# Patient Record
Sex: Female | Born: 1955 | Race: White | Hispanic: No | Smoking: Current every day smoker
Health system: Southern US, Community
[De-identification: ages and names within clinical notes are randomized; demographics above are authoritative.]

## PROBLEM LIST (undated history)

## (undated) DIAGNOSIS — K5792 Diverticulitis of intestine, part unspecified, without perforation or abscess without bleeding: Secondary | ICD-10-CM

## (undated) DIAGNOSIS — F32A Depression, unspecified: Secondary | ICD-10-CM

## (undated) DIAGNOSIS — I639 Cerebral infarction, unspecified: Secondary | ICD-10-CM

## (undated) DIAGNOSIS — F329 Major depressive disorder, single episode, unspecified: Secondary | ICD-10-CM

## (undated) DIAGNOSIS — N200 Calculus of kidney: Secondary | ICD-10-CM

## (undated) DIAGNOSIS — F4001 Agoraphobia with panic disorder: Secondary | ICD-10-CM

## (undated) DIAGNOSIS — I10 Essential (primary) hypertension: Secondary | ICD-10-CM

## (undated) DIAGNOSIS — K760 Fatty (change of) liver, not elsewhere classified: Secondary | ICD-10-CM

## (undated) HISTORY — DX: Diverticulitis of intestine, part unspecified, without perforation or abscess without bleeding: K57.92

## (undated) HISTORY — DX: Major depressive disorder, single episode, unspecified: F32.9

## (undated) HISTORY — DX: Calculus of kidney: N20.0

## (undated) HISTORY — DX: Fatty (change of) liver, not elsewhere classified: K76.0

## (undated) HISTORY — DX: Agoraphobia with panic disorder: F40.01

## (undated) HISTORY — DX: Cerebral infarction, unspecified: I63.9

## (undated) HISTORY — DX: Essential (primary) hypertension: I10

## (undated) HISTORY — DX: Depression, unspecified: F32.A

---

## 1980-06-02 HISTORY — PX: TONSILLECTOMY: SHX5217

## 1989-06-02 HISTORY — PX: TUBAL LIGATION: SHX77

## 1995-06-03 LAB — HM PAP SMEAR: HM Pap smear: NORMAL

## 1997-12-13 ENCOUNTER — Ambulatory Visit (HOSPITAL_COMMUNITY): Admission: RE | Admit: 1997-12-13 | Discharge: 1997-12-13 | Payer: Self-pay | Admitting: Family Medicine

## 2000-06-02 HISTORY — PX: OTHER SURGICAL HISTORY: SHX169

## 2000-12-31 ENCOUNTER — Ambulatory Visit (HOSPITAL_COMMUNITY): Admission: RE | Admit: 2000-12-31 | Discharge: 2000-12-31 | Payer: Self-pay | Admitting: Family Medicine

## 2001-07-15 ENCOUNTER — Encounter: Admission: RE | Admit: 2001-07-15 | Discharge: 2001-07-15 | Payer: Self-pay | Admitting: *Deleted

## 2001-09-14 ENCOUNTER — Encounter: Admission: RE | Admit: 2001-09-14 | Discharge: 2001-09-14 | Payer: Self-pay | Admitting: *Deleted

## 2001-09-15 ENCOUNTER — Other Ambulatory Visit: Admission: RE | Admit: 2001-09-15 | Discharge: 2001-09-15 | Payer: Self-pay | Admitting: *Deleted

## 2001-09-28 ENCOUNTER — Encounter: Admission: RE | Admit: 2001-09-28 | Discharge: 2001-09-28 | Payer: Self-pay | Admitting: *Deleted

## 2002-04-21 ENCOUNTER — Ambulatory Visit (HOSPITAL_BASED_OUTPATIENT_CLINIC_OR_DEPARTMENT_OTHER): Admission: RE | Admit: 2002-04-21 | Discharge: 2002-04-21 | Payer: Self-pay | Admitting: Obstetrics and Gynecology

## 2002-04-21 ENCOUNTER — Encounter (INDEPENDENT_AMBULATORY_CARE_PROVIDER_SITE_OTHER): Payer: Self-pay | Admitting: *Deleted

## 2002-08-01 ENCOUNTER — Emergency Department (HOSPITAL_COMMUNITY): Admission: EM | Admit: 2002-08-01 | Discharge: 2002-08-01 | Payer: Self-pay | Admitting: Emergency Medicine

## 2004-03-07 ENCOUNTER — Ambulatory Visit: Payer: Self-pay | Admitting: Family Medicine

## 2004-07-10 ENCOUNTER — Ambulatory Visit: Payer: Self-pay | Admitting: Family Medicine

## 2004-09-24 ENCOUNTER — Ambulatory Visit: Payer: Self-pay | Admitting: Family Medicine

## 2004-10-22 ENCOUNTER — Ambulatory Visit: Payer: Self-pay | Admitting: Family Medicine

## 2005-02-21 ENCOUNTER — Ambulatory Visit: Payer: Self-pay | Admitting: Obstetrics & Gynecology

## 2005-02-21 ENCOUNTER — Other Ambulatory Visit: Admission: RE | Admit: 2005-02-21 | Discharge: 2005-02-21 | Payer: Self-pay | Admitting: Obstetrics & Gynecology

## 2005-03-11 ENCOUNTER — Ambulatory Visit: Payer: Self-pay | Admitting: Obstetrics & Gynecology

## 2005-04-15 ENCOUNTER — Ambulatory Visit: Payer: Self-pay | Admitting: Obstetrics & Gynecology

## 2005-06-02 DIAGNOSIS — I639 Cerebral infarction, unspecified: Secondary | ICD-10-CM

## 2005-06-02 HISTORY — DX: Cerebral infarction, unspecified: I63.9

## 2005-09-06 ENCOUNTER — Emergency Department (HOSPITAL_COMMUNITY): Admission: EM | Admit: 2005-09-06 | Discharge: 2005-09-06 | Payer: Self-pay | Admitting: Family Medicine

## 2006-05-31 ENCOUNTER — Inpatient Hospital Stay (HOSPITAL_COMMUNITY): Admission: EM | Admit: 2006-05-31 | Discharge: 2006-06-02 | Payer: Self-pay | Admitting: Family Medicine

## 2007-03-07 ENCOUNTER — Emergency Department (HOSPITAL_COMMUNITY): Admission: EM | Admit: 2007-03-07 | Discharge: 2007-03-07 | Payer: Self-pay | Admitting: Emergency Medicine

## 2010-10-18 NOTE — H&P (Signed)
NAMEMAKARA, LANZO NO.:  [id number]   MEDICAL RECORD NO.:  [id number]          PATIENT TYPE:  EMS   LOCATION:  MAJO                         FACILITY:  [facility]   PHYSICIAN:  Marlan Palau, M.D.  DATE OF BIRTH:  [birth year]   DATE OF ADMISSION:  05/31/2006  DATE OF DISCHARGE:                              HISTORY & PHYSICAL   Audio too short to transcribe (less than 5 seconds)      C. Lesia Sago, M.D.     CKW/MEDQ  D:  05/31/2006  T:  05/31/2006  Job:  [id number]

## 2010-10-18 NOTE — Op Note (Signed)
   Diana Glenn, Diana Glenn                          ACCOUNT NO.:  [id number]   MEDICAL RECORD NO.:  [id number]                   PATIENT TYPE:  AMB   LOCATION:  [facility]                                 FACILITY:  [facility]   PHYSICIAN:  Katherine Roan, M.D.               DATE OF BIRTH:  08/26/1955   DATE OF PROCEDURE:  04/21/2002  DATE OF DISCHARGE:                                 OPERATIVE REPORT   PREOPERATIVE DIAGNOSES:  Dysmenorrhea and menorrhagia.   POSTOPERATIVE DIAGNOSES:  Dysmenorrhea and menorrhagia. Pelvic endometriosis  and small submucous uterine fibroid.   OPERATION PERFORMED:  Laparoscopy with LUNA procedure and ablation of  endometriosis with the YAG laser. Hysteroscopy with resection of a small  submucous fibroid.   DESCRIPTION OF PROCEDURE:  The patient was placed in lithotomy position,  prepped and draped in the usual fashion, bladder emptied. A transverse  incision was made in the umbilicus. The Veress needle was inserted and the  abdomen was distended with carbon dioxide. Aspiration infusion was utilized  to be sure that we were in the peritoneal cavity. A trocar was inserted into  the abdomen, visualization of the pelvis revealed two status post tubal  ligation areas on her tubes. There was focal endometriosis on the right  ovary on the left cul-de-sac and deep insertion of the uterosacral  ligaments. On the uterosacral ligaments bilaterally and anteriorly there was  evidence of old scarred endometriosis. Using the YAG laser, I was able to  ablate the endometriosis. Irrigation was accomplished. A LUNA procedure was  performed bilaterally. The gas was evacuated under direct vision and the  incisions which were two, 1 infraumbilical and 1 suprapubic were closed with  4-0 PDS along with a 2-0 Vicryl for the fascia on the umbilical incision.  Following this, I went down below and carefully dilated the cervix, put a  hysteroscope in the uterus and found a small right sided  submucous myoma  which I resected very carefully. All the resected material was sent to the  lab for study. Hemostasis was accomplished with the rollerball. No unusual  blood loss occurred. At the termination of the procedure, she was tolerating  the anesthesia well and was taken to the recovery room.                                                 Katherine Roan, M.D.    SDM/MEDQ  D:  04/21/2002  T:  04/21/2002  Job:  [id number]

## 2010-10-18 NOTE — Group Therapy Note (Signed)
NAMEAFTIN, LYE NO.:  [id number]   MEDICAL RECORD NO.:  [id number]          PATIENT TYPE:  WOC   LOCATION:  [facility]                   FACILITY:  [facility]   PHYSICIAN:  Elsie Lincoln, MD      DATE OF BIRTH:  27-Dec-1955   DATE OF SERVICE:  04/15/2005                                    CLINIC NOTE   The patient is a 55 year old female who returns to me for follow-up after  using Aldara for several weeks. The patient had a large reaction and stopped  using it and would like to switch to TCA. She does have some perianal warts  and 80% TCA was applied. The patient tolerated it well. She will come back  in 1 week for repeat application.           ______________________________  Elsie Lincoln, MD     KL/MEDQ  D:  04/15/2005  T:  04/16/2005  Job:  [id number]

## 2010-10-18 NOTE — Group Therapy Note (Signed)
NAMEMCKENZYE, CUTRIGHT NO.:  [id number]   MEDICAL RECORD NO.:  [id number]          PATIENT TYPE:  WOC   LOCATION:  [facility]                   FACILITY:  [facility]   PHYSICIAN:  Elsie Lincoln, MD      DATE OF BIRTH:  06-May-1956   DATE OF SERVICE:  03/11/2005                                    CLINIC NOTE   Patient is a 55 year old female who presented to me last week for evaluation  of vulvar lesion.  We did a biopsy which healed well and it comes back  seborrheic keratosis feature of condyloma acuminatum.  This is probably most  likely just warts so I offered her TCA application versus Aldara.  We did  have some samples of Aldara so we will try that.  She was given four samples  and told to apply to the area only three times a week and wash off 6-10  hours after application.  Patient is not to have sexual intercourse while  the cream is on her vulva and the patient once again was told that it has to  be done very sparingly.  If she gets exudative lesion that is weeping she  needs to stop the application for several days.  Patient come back to me in  two weeks for surveillance to see how she is doing.           ______________________________  Elsie Lincoln, MD     KL/MEDQ  D:  03/11/2005  T:  03/12/2005  Job:  [id number]

## 2010-10-18 NOTE — H&P (Signed)
NAMEERNESTEEN, MIHALIC NO.:  [id number]   MEDICAL RECORD NO.:  [id number]          PATIENT TYPE:  EMS   LOCATION:  MAJO                         FACILITY:  [facility]   PHYSICIAN:  Marlan Palau, M.D.  DATE OF BIRTH:  Dec 12, 1955   DATE OF ADMISSION:  05/31/2006  DATE OF DISCHARGE:                              HISTORY & PHYSICAL   HISTORY OF PRESENT ILLNESS:  Diana Glenn is a 55 year old right-  handed white female with a history of significant depression requiring  hospitalization in the past.  The patient is followed with Mental Health  for this.  This patient comes to [facility] for an evaluation  of onset of some slurred speech, gait disturbance, tendency to veer to  the right, some questionable right facial droop.  Patient has also  developed a bifrontal headache.  Patient claims she has headaches  frequently 2-3x a week for a number of years.  Patient has never had  events like this associated with her headache.  Patient has also noted  that her handwriting has been affected and it is quite clumsy and is  sloppy.  Patient does state that she has some problems with the use of  the right arm.  Patient, again, veers to the right when walking.  Has  not had any falls.  Patient went to urgent medical care and was referred  to [facility] for further evaluation.  A CT scan of the brain  was unremarkable by my reading.  At the current time, the patient scores  a zero on the NIH stroke scale.   PAST MEDICAL HISTORY:  Is significant for:  1. New onset of gait disturbance as above, rule out small-vessel      ischemic infarct, posterior circulation infarct.  2. Bilateral tubal ligation.  3. Laparoscopic surgery in the past.  4. Tonsillectomy.  5. Depression.  6. A history of headaches.   MEDICATIONS:  Include:  1. Effexor 300 mg daily.  2. Klonopin 0.5 mg daily.   Patient smokes a pack of cigarettes a day, drinks about 8 ounces of  Vodka a  night.   Patient states an ALLERGY to CORTISONE.   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   FAMILY MEDICAL HISTORY:  Notable that mother died with emphysema.  Father died with emphysema.  Patient has 12 brothers and sisters, 2 of  which have died, 1 by an overdose and 1 with COPD.  Has a history of  lung cancer in 1 sibling.  Another brother has bladder cancer.  A  history of diabetes in the family as well.   REVIEW OF SYSTEMS:  Notable for no recent fevers, chills.  Patient does  report the headaches as above.  Denies neck pain, shortness of breath,  chest pains or palpitations of the heart.  Denies any vomiting but did  have nausea earlier today.  Patient denies any problems with controlling  the bowels but notes some chronic problems  with stress incontinence of  the bladder.  Patient denies any numbness or focal weakness of the arms  or legs or face.  Has not had any blackout episodes or seizures.   PHYSICAL EXAMINATION:  VITALS:  Blood pressure is 145/94, heart rate  101, respiratory rate 18, temperature afebrile.  GENERAL:  This patient is a fairly well-developed white female who is  alert and cooperative at the time of examination.  HEENT EXAMINATION:  Head:  Atraumatic.  Eyes:  Pupils are equal, round,  react to light.  Disks are flat bilaterally.  NECK:  Supple.  No carotid bruits noted.  RESPIRATORY EXAMINATION:  Clear.  CARDIOVASCULAR EXAMINATION:  Reveals a regular rate and rhythm.  No  obvious murmurs or rubs noted.  EXTREMITIES:  All without significant edema.  ABDOMEN:  Reveals positive bowel sounds, no organomegaly or tenderness  noted.  NEUROLOGIC EXAMINATION:  Cranial nerves as above.  Facial symmetry is  seen.  Patient has good sensation to pinprick bilaterally on the face.  Has full extraocular movements.  No nystagmus is seen.  Visual fields  are  full to double simultaneous stimulation.  Speech is well-enunciated,  not aphasic.  Motor test reveals 5/5 strength in all fours.  Good  symmetric motor tone is noted throughout.  Sensory testing is intact to  pinprick, soft touch and vibratory sensation throughout.  Patient has  good finger-to-nose-to finger and heel-to-shin bilaterally.  Gait was  not tested.  No drift is seen in the upper or lower extremities.  Deep  tendon reflexes are symmetrical and normal.  Toes are neutral  bilaterally.   CT scan of the head is as above.   LABORATORY VALUES:  Include a white count of 12.4, hemoglobin of 15.9,  hematocrit of 46.6, MCV of 94.2, platelets of 269.  The rest of the  blood work is still pending.   IMPRESSION:  1. New onset of gait disturbance and clumsiness, rule out cerebellar      or small brain stem cerebrovascular infarct.  2. Hypertension, untreated.  3. Depression.   This patient does have some problems with blood pressure today.  Patient  does not really have a primary care physician and is followed through  mental health only.  Patient will be admitted at this point for a brief  workup to help manage blood pressures.  Patient is not a candidate for  tissue plasminogen activator due to duration of symptoms and to minimal  deficit.   PLAN:  1. Admission to [facility].  2. MRI of the brain.  3. MRI angiogram of the intracranial and extracranial vessels.  4. A 2-D echocardiogram.  5. Urine drug screen.  6. We will follow patient's clinical course while in-house.  We will      get physical and occupational therapy to see this patient.      Marlan Palau, M.D.  Electronically Signed     CKW/MEDQ  D:  05/31/2006  T:  06/01/2006  Job:  [id number]

## 2010-10-18 NOTE — Discharge Summary (Signed)
NAMEIVERNA, HAMMAC              ACCOUNT NO.:  [id number]   MEDICAL RECORD NO.:  [id number]          PATIENT TYPE:  INP   LOCATION:  3006                         FACILITY:  [facility]   PHYSICIAN:  Casimiro Needle L. Reynolds, M.D.DATE OF BIRTH:  10-24-1955   DATE OF ADMISSION:  05/31/2006  DATE OF DISCHARGE:  06/02/2006                               DISCHARGE SUMMARY   ADMISSION DIAGNOSES:  1. New onset gait disturbance with a clumsiness, rule out cerebellar      or small brain stem cerebellar infarct.  2. Untreated hypertension.  3. Depression.   DISCHARGE DIAGNOSES:  1. Left brain subcortical stroke with resolving mild right      hemiparesis.  2. Hypertension, improved control.  3. History of depression.  4. Tobacco abuse.   CONDITION ON DISCHARGE:  Improved.   DIET:  Low sat, low fat, low cholesterol.   ACTIVITY:  Ad lib.   MEDICATIONS AT DISCHARGE:  1. Aspirin 81 mg daily (new).  2. Lisinopril 10 mg daily (new).  3. Effexor 150 mg b.i.d.  4. Klonopin p.r.n.   STUDIES:  1. CT of the head performed May 31, 2006, demonstrating no acute      findings.  2. MRI of the brain performed June 01, 2006, demonstrating a small      acute non hemorrhagic infarct involving white matter adjacent to      the posterior aspect of left lateral ventricle with no significant      abnormalities.  3. MRA of the intracranial circulation demonstrating mild intracranial      atherosclerotic changes but no critical stenosis.  4. MR angiography of the neck with contrast demonstrating no evidence      of hemodynamically significant stenosis involving carotid      bifurcation and loss of signal on the right vetebral artery,      representing stenosis versus artifact.  5. Chest x-ray May 31, 2006, left base granulomata.  No active      cardiopulmonary disease.  6. Laboratory review:  CBC white count 12.4.  Hemoglobin 15.9.      Platelets [id number].  Coags normal.  Chemistry unremarkable.    Hemoglobin A1c 6.0.  Homocysteine normal at 12.9.  Cardiac enzymes      negative.  Lipid profile total cholesterol 185.  Triglycerides 149.      HDL 53.  LDL slightly elevated at 102.  Urine pregnancy test      negative.  Urine drug screen unremarkable.  7. Transthoracic cardiogram performed on June 01, 2006      demonstrated normal LV function.  EF 55%.  No regional wall motion      abnormalities.  Mildly increase of the aortic valve.  8. EKG June 01, 2006.  No significant T-wave abnormality.  Normal      sinus rhythm.  9. Carotid Doppler study June 02, 2007, demonstrating intracranial      vetebral artery flow with no significant.  ICA stenosis on either      side.  10.Transcranial Doppler study June 01, 2006, demonstrating normal      velocity using anterior and posterior  circulations.   HOSPITAL COURSE:  Please see admission H&P by Dr. Lesia Sago on  May 31, 2006, for full admission details.  Briefly, this is a 55-  year-old woman who presented to the emergency department with slurred  speech, gait disturbance, tendency to veer to the right and right facial  droop.  She had an NIH stroke score of 0 but was felt to have some mild  acute abnormalities.  She had a blood pressure of 145/94 in the  emergency department.  She was told she was likely to have a small  stroke and was subsequently admitted to the stroke service.  She  underwent a routine stroke workup with the results per tests as noted  above.  It was felt the most appropriate stroke prophylaxis for her  would be antistroke therapy with aspirin as well as risk factor  reduction with an ACE inhibitor.  She was started on low dose aspirin  and lisinopril was tolerated as well.  By June 01, 2006 she was up  walking around but complaining that she was still having difficulty with  her right leg.  By June 02, 2006 she was feeling well and wished to go  home.  She was discharged at that time on  aspirin and lisinopril and was  advised to stop smoking and followup with a primary care physician  regarding her blood pressure.   FOLLOWUP:  She was advised to establish a primary care physician and  followup regarding blood pressure.  She is also asked to call to make an  appointment with Dr. Anne Hahn at [facility] in 2 months.      Michael L. Thad Ranger, M.D.  Electronically Signed     MLR/MEDQ  D:  09/17/2006  T:  09/18/2006  Job:  [id number]

## 2010-10-18 NOTE — Discharge Summary (Signed)
NAMEMARILUZ, Diana Glenn              ACCOUNT NO.:  [id number]   MEDICAL RECORD NO.:  [id number]          PATIENT TYPE:  INP   LOCATION:  3006                         FACILITY:  [facility]   PHYSICIAN:  Casimiro Needle L. Reynolds, M.D.DATE OF BIRTH:  09-30-1955   DATE OF ADMISSION:  05/31/2006  DATE OF DISCHARGE:                               DISCHARGE SUMMARY   INAUDIBLE DICTATION      Casimiro Needle L. Thad Ranger, M.D.  Electronically Signed     MLR/MEDQ  D:  06/02/2006  T:  06/03/2006  Job:  [id number]

## 2010-10-18 NOTE — Group Therapy Note (Signed)
Diana Glenn, Diana Glenn NO.:  [id number]   MEDICAL RECORD NO.:  [id number]          PATIENT TYPE:  WOC   LOCATION:  [facility]                   FACILITY:  [facility]   PHYSICIAN:  Elsie Lincoln, MD      DATE OF BIRTH:  [birth year]   DATE OF SERVICE:                                    CLINIC NOTE   Patient is a 55 year old perimenopausal woman who was sent to me from [facility] for evaluation of possible condyloma versus skin tags on her perineum.  The patient states that the lesions have been there for at least a year, but  could be longer.  They do itch.  They occasionally bleed as well.  The  patient is sexually active with one partner for the past four years.  She  has had 2 menstrual cycles in the past 10 months.  The last one being 3  months ago.  The patient desires to have resolution of the lesions.   PAST MEDICAL HISTORY:  Panic attacks, depression.   PAST SURGICAL HISTORY:  A myomectomy and surgery for endometriosis.   PAST GYN HISTORY:  NSTD x4.  No STDs.  No abnormal Pap smears.  Positive  history of fibroids as noted above.   ALLERGIES:  Cortisone questionably trouble breathing.   CURRENT MEDICATIONS:  Effexor and Klonopin.   Last Pap smear March 2006.  Last mammogram 11 years ago.  The patient was  given information of a mammogram scholarship.   PHYSICAL EXAMINATION:  VITAL SIGNS:  Pulse 92, blood pressure 139/95. Weight  154.3.  Height 5 feet 4 inches.  FOCUSED EXAM:  Limited physical exam.  The anus and perineum are the focal  sites of the lesions. There are approximately 10 some variegated.  Others  are flat.  They are dark in pigment.  One definitely looks like a wart,  however, the other ones are more erythematous and more macular and  discolored. Recommended biopsy.  The patient was consented for biopsy.   PROCEDURE:  Consent obtained.  Area cleaned; 1 mL of 1% lidocaine injected  into the area.  A punch biopsy was obtained.  Hemostasis was  achieved with  __________ and 1 distal 4-0 Vicryl figure-of-eight.  Good hemostasis at the  end of the procedure.  The patient tolerated the procedure well.   IMPRESSION/PLAN:  A 55 year old with questionable warts versus other  pathology on her perineum.  The patient is to come back in 2 weeks for  results and possible treatment if just warts.           ______________________________  Elsie Lincoln, MD     KL/MEDQ  D:  02/21/2005  T:  02/24/2005  Job:  [id number]

## 2011-03-13 LAB — URINALYSIS, ROUTINE W REFLEX MICROSCOPIC
Protein, ur: NEGATIVE
pH: 5.5

## 2011-03-13 LAB — URINE CULTURE: Colony Count: 10000

## 2011-04-16 ENCOUNTER — Ambulatory Visit: Payer: Self-pay | Admitting: Family

## 2011-04-28 ENCOUNTER — Ambulatory Visit (INDEPENDENT_AMBULATORY_CARE_PROVIDER_SITE_OTHER): Payer: Self-pay | Admitting: Family

## 2011-04-28 ENCOUNTER — Encounter: Payer: Self-pay | Admitting: Family

## 2011-04-28 DIAGNOSIS — F32A Depression, unspecified: Secondary | ICD-10-CM

## 2011-04-28 DIAGNOSIS — Z8719 Personal history of other diseases of the digestive system: Secondary | ICD-10-CM

## 2011-04-28 DIAGNOSIS — Z8673 Personal history of transient ischemic attack (TIA), and cerebral infarction without residual deficits: Secondary | ICD-10-CM

## 2011-04-28 DIAGNOSIS — Z87442 Personal history of urinary calculi: Secondary | ICD-10-CM

## 2011-04-28 DIAGNOSIS — F3289 Other specified depressive episodes: Secondary | ICD-10-CM

## 2011-04-28 DIAGNOSIS — F419 Anxiety disorder, unspecified: Secondary | ICD-10-CM

## 2011-04-28 DIAGNOSIS — F17200 Nicotine dependence, unspecified, uncomplicated: Secondary | ICD-10-CM

## 2011-04-28 DIAGNOSIS — F411 Generalized anxiety disorder: Secondary | ICD-10-CM

## 2011-04-28 DIAGNOSIS — I10 Essential (primary) hypertension: Secondary | ICD-10-CM

## 2011-04-28 DIAGNOSIS — F329 Major depressive disorder, single episode, unspecified: Secondary | ICD-10-CM

## 2011-04-28 DIAGNOSIS — Z720 Tobacco use: Secondary | ICD-10-CM

## 2011-04-28 MED ORDER — LISINOPRIL 10 MG PO TABS: 10.0000 mg | ORAL_TABLET | Freq: Every day | ORAL | Status: DC

## 2011-04-28 NOTE — Progress Notes (Signed)
Subjective:    Patient ID: Diana Glenn, female    DOB: 24-Dec-1955, 55 y.o.   MRN: [id number]  HPI Diana Glenn   HTN- She was diagnosed in 2007.   She is treated with lisinopril.  She reports that her sbp often ranges 120's to 140's.    Depression- She is treated with zoloft.  She has been on medication for her depression x 30 yrs.  She was on effexor when she had a stroke.  She had one hospitalization for depression in 1984 for 2 months ([facility]).  She reports that she had agoraphobia.  She reports that in the last year her depression has been well controlled despite a recent move from [place] to [place].    Anxiety-  She reports that she has rare panic attacks.  She uses klonopin 1/2 tablet a day.  Diverticulitis- diagnosed at [facility] (2 weeks) ago.  She was treated with flagyl.  She notes intermittent diarrhea alternating with diarrhea.  Pain is resolved. Denies bloody stools.   Kidney stones-  She reports that she has had 3 episodes of kidney stones in the last year.  She saw Urology. She was seen by Urology in [place].    Stroke-  This occurred in 2007.  She initially had right arm/leg weakness.  She reports that her BP was high at that time.  She was treated at [facility].  She denies any residual weakness or deficits.    Tobacco abuse- she is smoking 1 1/2 PPD.  She has tried the patch in the past as well as wellbutrin.  She had some muscle jerking on the Wellbutrin.  ETOH use- was having a "couple of drinks." She stopped about 2 weeks ago.       Review of Systems  Constitutional:       She notes several pound weight loss with diverticulitis was 168 pounds.  HENT: Negative for congestion.   Respiratory:       Notes mild ACE cough.  Cardiovascular: Negative for chest pain and leg swelling.  Gastrointestinal: Negative for abdominal pain and blood in stool.  Genitourinary:       Stress incontinence. LMP 18 months ago  Musculoskeletal: Negative for myalgias and arthralgias.    Skin: Negative for rash.  Neurological:       Occasional Ha's that she attributes to sinus.  Hematological: Does not bruise/bleed easily.  Psychiatric/Behavioral:       See HPI   Past Medical History  Diagnosis Date  . Depression   . Diverticulitis   . Hypertension   . Stroke 2007  . Kidney stone   . Urinary incontinence   . Agoraphobia with panic attacks     History   Social History  . Marital Status: Widowed    Spouse Name: N/A    Number of Children: 4  . Years of Education: N/A   Occupational History  . Not on file.   Social History Main Topics  . Smoking status: Current Everyday Smoker -- 1.5 packs/day for 30 years    Types: Cigarettes  . Smokeless tobacco: Not on file  . Alcohol Use: No  . Drug Use: No  . Sexually Active: Not on file   Other Topics Concern  . Not on file   Social History Narrative   Caffeine Use:  2 cups dailyRegular exercise:  No    Past Surgical History  Procedure Date  . Tonsillectomy 1982  . Tubal ligation 1991  . Uterine ablation 2002  Family History  Problem Relation Age of Onset  . Cancer Sister     lung  . Hypertension Brother   . Cancer Sister     lung  . Hypertension Brother   . Diabetes Brother   . Hypertension Brother   . Diabetes Brother     Allergies  Allergen Reactions  . Bactrim Itching and Rash    No current outpatient prescriptions on file prior to visit.    BP 94/70  Pulse 78  Temp(Src) 97.9 F (36.6 C) (Oral)  Resp 16  Ht 5\' 4"  (1.626 m)  Wt 161 lb (73.029 kg)  BMI 27.64 kg/m2  LMP 06/27/2008       Objective:   Physical Exam  Constitutional: She appears well-developed and well-nourished. No distress.  HENT:  Head: Normocephalic and atraumatic.  Eyes: Conjunctivae are normal.  Neck: Normal range of motion. Neck supple.  Cardiovascular: Normal rate and regular rhythm.   No murmur heard. Pulmonary/Chest: Effort normal and breath sounds normal. No respiratory distress. She has no  wheezes. She has no rales. She exhibits no tenderness.  Abdominal: Soft. Bowel sounds are normal. She exhibits no distension and no mass. There is no tenderness. There is no rebound and no guarding.  Musculoskeletal: She exhibits no edema.  Skin: Skin is warm and dry.  Psychiatric: She has a normal mood and affect. Her behavior is normal. Judgment normal.          Assessment & Plan:

## 2011-04-28 NOTE — Patient Instructions (Addendum)
Please follow up in 1 month for a complete physical.  Come fasting to this appointment.   Welcome to [facility]!

## 2011-04-29 ENCOUNTER — Telehealth: Payer: Self-pay | Admitting: *Deleted

## 2011-04-29 NOTE — Telephone Encounter (Signed)
Records received from [facility] for 04/10/11 date of service and forwarded to Provider for review.

## 2011-05-02 DIAGNOSIS — I10 Essential (primary) hypertension: Secondary | ICD-10-CM | POA: Insufficient documentation

## 2011-05-02 DIAGNOSIS — Z8673 Personal history of transient ischemic attack (TIA), and cerebral infarction without residual deficits: Secondary | ICD-10-CM | POA: Insufficient documentation

## 2011-05-02 DIAGNOSIS — F419 Anxiety disorder, unspecified: Secondary | ICD-10-CM | POA: Insufficient documentation

## 2011-05-02 DIAGNOSIS — Z8719 Personal history of other diseases of the digestive system: Secondary | ICD-10-CM | POA: Insufficient documentation

## 2011-05-02 DIAGNOSIS — F418 Other specified anxiety disorders: Secondary | ICD-10-CM | POA: Insufficient documentation

## 2011-05-02 DIAGNOSIS — Z87442 Personal history of urinary calculi: Secondary | ICD-10-CM | POA: Insufficient documentation

## 2011-05-02 DIAGNOSIS — Z720 Tobacco use: Secondary | ICD-10-CM | POA: Insufficient documentation

## 2011-05-02 NOTE — Assessment & Plan Note (Signed)
Has seen urology in the past.  Stable at present. Monitor.

## 2011-05-02 NOTE — Assessment & Plan Note (Signed)
Pt was counseled on smoking cessation.  

## 2011-05-02 NOTE — Assessment & Plan Note (Signed)
BP is stable on lisinopril. Continue same.  

## 2011-05-02 NOTE — Assessment & Plan Note (Signed)
Clinically resolved.  

## 2011-05-02 NOTE — Assessment & Plan Note (Signed)
Stable. Will need aggressive BP and lipid control. Continue ASA

## 2011-05-02 NOTE — Assessment & Plan Note (Signed)
Stable on PRN klonopin.

## 2011-05-02 NOTE — Assessment & Plan Note (Signed)
Stable on Zoloft, continue same. 

## 2011-05-13 ENCOUNTER — Ambulatory Visit (INDEPENDENT_AMBULATORY_CARE_PROVIDER_SITE_OTHER): Payer: Self-pay | Admitting: Family

## 2011-05-13 ENCOUNTER — Encounter: Payer: Self-pay | Admitting: Family

## 2011-05-13 VITALS — BP 128/88 | HR 90 | Temp 98.1°F | Resp 16 | Wt 157.1 lb

## 2011-05-13 DIAGNOSIS — N390 Urinary tract infection, site not specified: Secondary | ICD-10-CM | POA: Insufficient documentation

## 2011-05-13 LAB — POCT URINALYSIS DIPSTICK
Ketones, UA: NEGATIVE
Protein, UA: NEGATIVE
Spec Grav, UA: >=1.03
Urobilinogen, UA: NEGATIVE

## 2011-05-13 MED ORDER — CIPROFLOXACIN HCL 500 MG PO TABS: 500.0000 mg | ORAL_TABLET | Freq: Two times a day (BID) | ORAL | Status: AC

## 2011-05-13 NOTE — Patient Instructions (Signed)
Please call if you develop worsening low back pain, nausea, fever over 101, or if you are not feeling better in 2-3 days.

## 2011-05-13 NOTE — Assessment & Plan Note (Signed)
UA suggestive of UTI. She also has blood in her urine and a hx of kidney stones.  I have suggested that she complete a CT to further evaluate for stones.  I discussed with the patient that it can be dangerous if the stones are causing a blockage.  She declines CT at this time due to cost but tells me that if her symptoms worsen or are not improved in a few days then she will consider completing.  Will send urine for culture and start cipro.

## 2011-05-13 NOTE — Progress Notes (Signed)
  Subjective:    Patient ID: Diana Glenn, female    DOB: [birth year], 55 y.o.   MRN: [id number]  HPI  [name] is a 55 yr old female who presents today with chief complaint of dysuria and frequency. Symptoms started 3 days ago.  She denies fever or hematuria.  She has a history of kidney stones.  She reports some right sided low back pain, right lower abdominal pain.     Review of Systems See HPI  Past Medical History  Diagnosis Date  . Depression   . Diverticulitis   . Hypertension   . Stroke 2007  . Kidney stone   . Urinary incontinence   . Agoraphobia with panic attacks     History   Social History  . Marital Status: Widowed    Spouse Name: N/A    Number of Children: 4  . Years of Education: N/A   Occupational History  . Not on file.   Social History Main Topics  . Smoking status: Current Everyday Smoker -- 1.5 packs/day for 30 years    Types: Cigarettes  . Smokeless tobacco: Not on file  . Alcohol Use: No  . Drug Use: No  . Sexually Active: Not on file   Other Topics Concern  . Not on file   Social History Narrative   Caffeine Use:  2 cups dailyRegular exercise:  No    Past Surgical History  Procedure Date  . Tonsillectomy 1982  . Tubal ligation 1991  . Uterine ablation 2002    Family History  Problem Relation Age of Onset  . Cancer Sister     lung  . Hypertension Brother   . Cancer Sister     lung  . Hypertension Brother   . Diabetes Brother   . Hypertension Brother   . Diabetes Brother     Allergies  Allergen Reactions  . Bactrim Itching and Rash    Current Outpatient Prescriptions on File Prior to Visit  Medication Sig Dispense Refill  . aspirin 81 MG chewable tablet Chew 81 mg by mouth daily.        . clonazePAM (KLONOPIN) 1 MG tablet Take 1 mg by mouth 2 (two) times daily as needed.        Marland Kitchen lisinopril (PRINIVIL,ZESTRIL) 10 MG tablet Take 1 tablet (10 mg total) by mouth daily.  30 tablet  2  . sertraline (ZOLOFT) 100 MG tablet  Take 1 1/2 tablets once a day.         BP 128/88  Pulse 90  Temp(Src) 98.1 F (36.7 C) (Oral)  Resp 16  Wt 157 lb 1.9 oz (71.269 kg)  LMP 06/27/2008       Objective:   Physical Exam  Constitutional: She appears well-developed and well-nourished. No distress.  Cardiovascular: Normal rate and regular rhythm.   No murmur heard. Pulmonary/Chest: Effort normal and breath sounds normal. No respiratory distress. She has no wheezes. She has no rales. She exhibits no tenderness.  Abdominal: Soft. Bowel sounds are normal. She exhibits no distension and no mass. There is no rebound and no guarding.       Mild lower abdominal pain to palpation.   Genitourinary:       Neg CVAT bilaterally          Assessment & Plan:

## 2011-05-14 ENCOUNTER — Telehealth: Payer: Self-pay | Admitting: Family

## 2011-05-14 NOTE — Telephone Encounter (Signed)
Received medical records from [facility].

## 2011-05-14 NOTE — Telephone Encounter (Signed)
Patient states that [facility] on [address] told her that they were backlogged on cipro. Is there anything else that we could give her?

## 2011-05-14 NOTE — Telephone Encounter (Signed)
Spoke to [place] at [place] and he states they were temporarily out of stock but have received shipment today. He reports that they transferred Rx to [facility] on [address]. Pt notified.

## 2011-05-16 LAB — URINE CULTURE: Colony Count: >=100000

## 2011-05-22 NOTE — Telephone Encounter (Signed)
Records forwarded to Provider for review. 

## 2011-11-07 ENCOUNTER — Encounter: Payer: Self-pay | Admitting: Family

## 2011-11-07 ENCOUNTER — Ambulatory Visit (INDEPENDENT_AMBULATORY_CARE_PROVIDER_SITE_OTHER): Payer: Self-pay | Admitting: Family

## 2011-11-07 VITALS — BP 102/80 | HR 97 | Temp 98.2°F | Resp 16 | Ht 64.0 in | Wt 161.0 lb

## 2011-11-07 DIAGNOSIS — R32 Unspecified urinary incontinence: Secondary | ICD-10-CM

## 2011-11-07 DIAGNOSIS — I10 Essential (primary) hypertension: Secondary | ICD-10-CM

## 2011-11-07 MED ORDER — SOLIFENACIN SUCCINATE 5 MG PO TABS: 5.0000 mg | ORAL_TABLET | Freq: Every day | ORAL | Status: DC

## 2011-11-07 MED ORDER — LISINOPRIL 5 MG PO TABS: 5.0000 mg | ORAL_TABLET | Freq: Every day | ORAL | Status: DC

## 2011-11-07 NOTE — Patient Instructions (Signed)
Please schedule a follow up appointment in 1 month.

## 2011-11-07 NOTE — Assessment & Plan Note (Signed)
BP is low today. Will wean down to 5 mg of lisinopril and plan to have pt follow up in 1 month.  Also, will request records from [facility] ED- need to see recent bmet results.

## 2011-11-07 NOTE — Progress Notes (Signed)
Subjective:    Patient ID: Diana Glenn, female    DOB: Jul 23, 1955, 56 y.o.   MRN: [id number]  HPI  [name] is a 56 yr old female who presents today for follow up.  She remains without health insurance.  Htn- using her brother's lisinopril.  She reports episode of elevated BP a few weeks ago during a "gallbladder attack."  She was seen in the ED and told that she had "sludge."  Declines referral to surgeon at this point.  She is trying to watch her diet.  Urinary incontinence- report frequent incontinence which is worst with coughing, laughing, sneezing, movements.  Wears poise pads.   Review of Systems See HPI  Past Medical History  Diagnosis Date  . Depression   . Diverticulitis   . Hypertension   . Stroke 2007  . Kidney stone   . Urinary incontinence   . Agoraphobia with panic attacks     History   Social History  . Marital Status: Widowed    Spouse Name: N/A    Number of Children: 4  . Years of Education: N/A   Occupational History  . Not on file.   Social History Main Topics  . Smoking status: Current Everyday Smoker -- 1.5 packs/day for 30 years    Types: Cigarettes  . Smokeless tobacco: Not on file  . Alcohol Use: No  . Drug Use: No  . Sexually Active: Not on file   Other Topics Concern  . Not on file   Social History Narrative   Caffeine Use:  2 cups dailyRegular exercise:  No    Past Surgical History  Procedure Date  . Tonsillectomy 1982  . Tubal ligation 1991  . Uterine ablation 2002    Family History  Problem Relation Age of Onset  . Cancer Sister     lung  . Hypertension Brother   . Cancer Sister     lung  . Hypertension Brother   . Diabetes Brother   . Hypertension Brother   . Diabetes Brother     Allergies  Allergen Reactions  . Bactrim Itching and Rash    Current Outpatient Prescriptions on File Prior to Visit  Medication Sig Dispense Refill  . aspirin 81 MG chewable tablet Chew 81 mg by mouth daily.        .  clonazePAM (KLONOPIN) 1 MG tablet Take 1 mg by mouth 2 (two) times daily as needed.        . sertraline (ZOLOFT) 100 MG tablet Take 1 1/2 tablets once a day.       Marland Kitchen DISCONTD: lisinopril (PRINIVIL,ZESTRIL) 10 MG tablet Take 1 tablet (10 mg total) by mouth daily.  30 tablet  2  . solifenacin (VESICARE) 5 MG tablet Take 1 tablet (5 mg total) by mouth daily.  28 tablet  0    BP 102/80  Pulse 97  Temp(Src) 98.2 F (36.8 C) (Oral)  Resp 16  Ht 5\' 4"  (1.626 m)  Wt 161 lb (73.029 kg)  BMI 27.64 kg/m2  SpO2 95%  LMP 06/27/2008       Objective:   Physical Exam  Constitutional: She appears well-developed and well-nourished. No distress.  Cardiovascular: Normal rate and regular rhythm.   No murmur heard. Pulmonary/Chest: Effort normal and breath sounds normal. No respiratory distress. She has no wheezes. She has no rales. She exhibits no tenderness.  Musculoskeletal: She exhibits no edema.  Psychiatric: She has a normal mood and affect. Her behavior is normal. Judgment  and thought content normal.          Assessment & Plan:   BP Readings from Last 3 Encounters:  11/07/11 102/80  05/13/11 128/88  04/28/11 94/70   Some days her BP has been in the 90's.

## 2011-11-07 NOTE — Assessment & Plan Note (Signed)
Deteriorated. Recommended kegel exercises.  Will also give trial of vesicare.  Samples provided today.

## 2011-11-11 ENCOUNTER — Encounter: Payer: Self-pay | Admitting: Family

## 2011-11-11 ENCOUNTER — Telehealth: Payer: Self-pay | Admitting: *Deleted

## 2011-11-11 DIAGNOSIS — K760 Fatty (change of) liver, not elsewhere classified: Secondary | ICD-10-CM | POA: Insufficient documentation

## 2011-11-11 NOTE — Telephone Encounter (Signed)
Received records from [facility] ER for May 2013 visit and forwarded to Provider for review.

## 2011-12-08 ENCOUNTER — Encounter: Payer: Self-pay | Admitting: Family

## 2011-12-08 ENCOUNTER — Ambulatory Visit (INDEPENDENT_AMBULATORY_CARE_PROVIDER_SITE_OTHER): Payer: Self-pay | Admitting: Family

## 2011-12-08 VITALS — BP 118/74 | HR 87 | Temp 97.4°F | Resp 16 | Ht 64.0 in | Wt 163.1 lb

## 2011-12-08 DIAGNOSIS — F3289 Other specified depressive episodes: Secondary | ICD-10-CM

## 2011-12-08 DIAGNOSIS — F329 Major depressive disorder, single episode, unspecified: Secondary | ICD-10-CM

## 2011-12-08 DIAGNOSIS — I10 Essential (primary) hypertension: Secondary | ICD-10-CM

## 2011-12-08 DIAGNOSIS — F32A Depression, unspecified: Secondary | ICD-10-CM

## 2011-12-08 DIAGNOSIS — R32 Unspecified urinary incontinence: Secondary | ICD-10-CM

## 2011-12-08 MED ORDER — LISINOPRIL 5 MG PO TABS: 5.0000 mg | ORAL_TABLET | Freq: Every day | ORAL | Status: DC

## 2011-12-08 NOTE — Patient Instructions (Addendum)
Please follow up in 3 months, sooner if problems/concerns.  

## 2011-12-08 NOTE — Assessment & Plan Note (Signed)
Trial of vesicare 

## 2011-12-08 NOTE — Assessment & Plan Note (Signed)
BP Readings from Last 3 Encounters:  12/08/11 118/74  11/07/11 102/80  05/13/11 128/88  BP is improved.  I received records from [facility], but lab work was not included. Will request lab work- I would like to see a recent bmet. Continue reduced dose of lisinopril.

## 2011-12-08 NOTE — Progress Notes (Signed)
Subjective:    Patient ID: Diana Glenn, female    DOB: 04-24-1956, 56 y.o.   MRN: [id number]  HPI  Diana Glenn is a 56 yr old female who presents today for follow up.   HTN-  Last visit lisinopril was decreased from 10mg  to 5mg .  She reports feeling well on this dose. She denies cp/sob or swelling.  Urinary incontinence- unchanged. she has not yet started vesicare.   Anxiety/Depression- she reports that the facility she was seeing for her is no longer seeing pts. She has intake apt elsewhere on 8/1 that she plans to keep. She reports that depression is well controlled.  For the most part her anxiety is well controlled but she tells me that she sometimes worries about things such as her health.   Review of Systems See HPI  Past Medical History  Diagnosis Date  . Depression   . Diverticulitis   . Hypertension   . Stroke 2007  . Kidney stone   . Urinary incontinence   . Agoraphobia with panic attacks   . Fatty liver     per ultrasound 5/13    History   Social History  . Marital Status: Widowed    Spouse Name: N/A    Number of Children: 4  . Years of Education: N/A   Occupational History  . Not on file.   Social History Main Topics  . Smoking status: Current Everyday Smoker -- 1.5 packs/day for 30 years    Types: Cigarettes  . Smokeless tobacco: Not on file  . Alcohol Use: No  . Drug Use: No  . Sexually Active: Not on file   Other Topics Concern  . Not on file   Social History Narrative   Caffeine Use:  2 cups dailyRegular exercise:  No    Past Surgical History  Procedure Date  . Tonsillectomy 1982  . Tubal ligation 1991  . Uterine ablation 2002    Family History  Problem Relation Age of Onset  . Cancer Sister     lung  . Hypertension Brother   . Cancer Sister     lung  . Hypertension Brother   . Diabetes Brother   . Hypertension Brother   . Diabetes Brother     Allergies  Allergen Reactions  . Bactrim Itching and Rash    Current  Outpatient Prescriptions on File Prior to Visit  Medication Sig Dispense Refill  . aspirin 81 MG chewable tablet Chew 81 mg by mouth daily.        . clonazePAM (KLONOPIN) 1 MG tablet Take 1 mg by mouth 2 (two) times daily as needed.        . sertraline (ZOLOFT) 100 MG tablet Take 1 1/2 tablets once a day.       Marland Kitchen DISCONTD: lisinopril (PRINIVIL,ZESTRIL) 5 MG tablet Take 1 tablet (5 mg total) by mouth daily.  30 tablet  0  . solifenacin (VESICARE) 5 MG tablet Take 1 tablet (5 mg total) by mouth daily.  28 tablet  0    BP 118/74  Pulse 87  Temp 97.4 F (36.3 C) (Oral)  Resp 16  Ht 5\' 4"  (1.626 m)  Wt 163 lb 1.9 oz (73.991 kg)  BMI 28.00 kg/m2  SpO2 96%  LMP 06/27/2008       Objective:   Physical Exam  Constitutional: She appears well-developed and well-nourished. No distress.  Cardiovascular:  No murmur heard. Musculoskeletal: She exhibits no edema.  Psychiatric: She has a normal mood and  affect. Her behavior is normal. Judgment and thought content normal.          Assessment & Plan:

## 2011-12-08 NOTE — Assessment & Plan Note (Signed)
Stable.  Pt to continue with psych. I did tell her that I would refill her sertraline if necessary during the transition period.

## 2012-01-28 ENCOUNTER — Encounter: Payer: Self-pay | Admitting: Family

## 2012-01-28 ENCOUNTER — Ambulatory Visit (INDEPENDENT_AMBULATORY_CARE_PROVIDER_SITE_OTHER): Payer: Self-pay | Admitting: Family

## 2012-01-28 VITALS — BP 122/80 | HR 103 | Temp 97.6°F | Resp 14 | Ht 64.0 in | Wt 164.0 lb

## 2012-01-28 DIAGNOSIS — R109 Unspecified abdominal pain: Secondary | ICD-10-CM

## 2012-01-28 DIAGNOSIS — M545 Low back pain, unspecified: Secondary | ICD-10-CM

## 2012-01-28 DIAGNOSIS — R319 Hematuria, unspecified: Secondary | ICD-10-CM

## 2012-01-28 DIAGNOSIS — R3129 Other microscopic hematuria: Secondary | ICD-10-CM

## 2012-01-28 LAB — POCT URINALYSIS DIPSTICK
Bilirubin, UA: NEGATIVE
Glucose, UA: NEGATIVE
Ketones, UA: NEGATIVE
Leukocytes, UA: NEGATIVE
Nitrite, UA: NEGATIVE
Spec Grav, UA: 1.02
Urobilinogen, UA: 0.2
pH, UA: 6

## 2012-01-28 MED ORDER — CIPROFLOXACIN HCL 500 MG PO TABS: 500.0000 mg | ORAL_TABLET | Freq: Two times a day (BID) | ORAL | Status: AC

## 2012-01-28 MED ORDER — METRONIDAZOLE 500 MG PO TABS: 500.0000 mg | ORAL_TABLET | Freq: Three times a day (TID) | ORAL | Status: AC

## 2012-01-28 NOTE — Patient Instructions (Addendum)
Go directly to the ED if you develop worsening abdominal pain, bright red blood in urine or fever >101.  Follow up on Friday for office visit.

## 2012-01-28 NOTE — Assessment & Plan Note (Signed)
56 yr old female with right lower abdominal pain.  Neg CVAT noted.  No abdominal tenderness on exam.  + hematuria on dip. Ideally, I would like to obtain CT abd/pelvis to further evaluate for kidney stone.  Pt has hx of diverticulitis as well which is a possibility.  Appendicitis is another possibility, but my clinical suspicion is low.  She refuses CT today as she is uninsured.  I have advised her to go to ED as outlined in AVS.  In the meantime, will plan empiric rx for UTI and diverticulitis with close follow up.

## 2012-01-28 NOTE — Progress Notes (Signed)
Subjective:    Patient ID: Diana Glenn, female    DOB: 1956/03/01, 56 y.o.   MRN: [id number]  HPI  Diana Glenn is a 56 yr old female who presents today with chief complaint of right sided abdominal pain.  She points to the right lower abdomen- almost suprapubic region.  Feels "like I am swollen." Reports that she woke up at 4AM this morning, vomitting, could not get comfortable.  Took ibuprofen, klonopin, layed down to go to sleep.  Feels a little better but reports that she feels nausea and this is "bad." Denies hematuria, dysuria,  Fever.  BM's are normal.     Review of Systems See HPI  Past Medical History  Diagnosis Date  . Depression   . Diverticulitis   . Hypertension   . Stroke 2007  . Kidney stone   . Urinary incontinence   . Agoraphobia with panic attacks   . Fatty liver     per ultrasound 5/13    History   Social History  . Marital Status: Widowed    Spouse Name: N/A    Number of Children: 4  . Years of Education: N/A   Occupational History  . Not on file.   Social History Main Topics  . Smoking status: Current Everyday Smoker -- 1.5 packs/day for 30 years    Types: Cigarettes  . Smokeless tobacco: Not on file  . Alcohol Use: No  . Drug Use: No  . Sexually Active: Not on file   Other Topics Concern  . Not on file   Social History Narrative   Caffeine Use:  2 cups dailyRegular exercise:  No    Past Surgical History  Procedure Date  . Tonsillectomy 1982  . Tubal ligation 1991  . Uterine ablation 2002    Family History  Problem Relation Age of Onset  . Cancer Sister     lung  . Hypertension Brother   . Cancer Sister     lung  . Hypertension Brother   . Diabetes Brother   . Hypertension Brother   . Diabetes Brother     Allergies  Allergen Reactions  . Bactrim Itching and Rash    Current Outpatient Prescriptions on File Prior to Visit  Medication Sig Dispense Refill  . aspirin 81 MG chewable tablet Chew 81 mg by mouth daily.          . clonazePAM (KLONOPIN) 1 MG tablet Take 1 mg by mouth 2 (two) times daily as needed.        Marland Kitchen lisinopril (PRINIVIL,ZESTRIL) 5 MG tablet Take 1 tablet (5 mg total) by mouth daily.  30 tablet  2  . sertraline (ZOLOFT) 100 MG tablet Take 1 1/2 tablets once a day.         BP 122/80  Pulse 103  Temp 97.6 F (36.4 C) (Oral)  Resp 14  Ht 5\' 4"  (1.626 m)  Wt 164 lb (74.39 kg)  BMI 28.15 kg/m2  SpO2 97%  LMP 06/27/2008       Objective:   Physical Exam  Constitutional: She appears well-developed and well-nourished. No distress.  Cardiovascular: Normal rate and regular rhythm.   No murmur heard. Pulmonary/Chest: Effort normal and breath sounds normal. No respiratory distress. She has no wheezes. She has no rales. She exhibits no tenderness.  Abdominal: Soft. Bowel sounds are normal. She exhibits no distension and no mass. There is no tenderness. There is no rebound and no guarding.  Genitourinary:  Neg CVAT bilaterally.  Skin: Skin is warm and dry.  Psychiatric: She has a normal mood and affect. Her behavior is normal. Judgment and thought content normal.          Assessment & Plan:

## 2012-01-30 ENCOUNTER — Encounter: Payer: Self-pay | Admitting: Family

## 2012-01-30 ENCOUNTER — Ambulatory Visit (INDEPENDENT_AMBULATORY_CARE_PROVIDER_SITE_OTHER): Payer: Self-pay | Admitting: Family

## 2012-01-30 VITALS — BP 118/82 | HR 94 | Temp 98.1°F | Resp 16 | Ht 64.0 in | Wt 164.1 lb

## 2012-01-30 DIAGNOSIS — R109 Unspecified abdominal pain: Secondary | ICD-10-CM

## 2012-01-30 LAB — URINE CULTURE

## 2012-01-30 NOTE — Progress Notes (Signed)
Subjective:    Patient ID: Diana Glenn, female    DOB: 03-May-1956, 56 y.o.   MRN: [id number]  HPI  [name] is a 56 yr old female who presents today for follow up of her abdominal pain. She reports resolution of her abdominal pain and back pain. Denies nausea. She is taking metronidazole and cipro.  Denies fever. Energy is improved.  Review of Systems See HPI  Past Medical History  Diagnosis Date  . Depression   . Diverticulitis   . Hypertension   . Stroke 2007  . Kidney stone   . Urinary incontinence   . Agoraphobia with panic attacks   . Fatty liver     per ultrasound 5/13    History   Social History  . Marital Status: Widowed    Spouse Name: N/A    Number of Children: 4  . Years of Education: N/A   Occupational History  . Not on file.   Social History Main Topics  . Smoking status: Current Everyday Smoker -- 1.5 packs/day for 30 years    Types: Cigarettes  . Smokeless tobacco: Not on file  . Alcohol Use: No  . Drug Use: No  . Sexually Active: Not on file   Other Topics Concern  . Not on file   Social History Narrative   Caffeine Use:  2 cups dailyRegular exercise:  No    Past Surgical History  Procedure Date  . Tonsillectomy 1982  . Tubal ligation 1991  . Uterine ablation 2002    Family History  Problem Relation Age of Onset  . Cancer Sister     lung  . Hypertension Brother   . Cancer Sister     lung  . Hypertension Brother   . Diabetes Brother   . Hypertension Brother   . Diabetes Brother     Allergies  Allergen Reactions  . Bactrim Itching and Rash    Current Outpatient Prescriptions on File Prior to Visit  Medication Sig Dispense Refill  . aspirin 81 MG chewable tablet Chew 81 mg by mouth daily.        . ciprofloxacin (CIPRO) 500 MG tablet Take 1 tablet (500 mg total) by mouth 2 (two) times daily.  14 tablet  0  . clonazePAM (KLONOPIN) 1 MG tablet Take 1 mg by mouth 2 (two) times daily as needed.        Marland Kitchen lisinopril  (PRINIVIL,ZESTRIL) 5 MG tablet Take 1 tablet (5 mg total) by mouth daily.  30 tablet  2  . metroNIDAZOLE (FLAGYL) 500 MG tablet Take 1 tablet (500 mg total) by mouth 3 (three) times daily.  21 tablet  0  . sertraline (ZOLOFT) 100 MG tablet Take 1 1/2 tablets once a day.         BP 118/82  Pulse 94  Temp 98.1 F (36.7 C) (Oral)  Resp 16  Ht 5\' 4"  (1.626 m)  Wt 164 lb 1.3 oz (74.426 kg)  BMI 28.16 kg/m2  SpO2 94%  LMP 06/27/2008       Objective:   Physical Exam  Constitutional: She is oriented to person, place, and time. She appears well-developed and well-nourished. No distress.  HENT:  Head: Normocephalic and atraumatic.  Cardiovascular: Normal rate and regular rhythm.   No murmur heard. Pulmonary/Chest: Effort normal and breath sounds normal. No respiratory distress. She has no wheezes. She has no rales. She exhibits no tenderness.  Abdominal: Soft. Bowel sounds are normal. She exhibits no distension and no mass.  There is no tenderness. There is no rebound and no guarding.  Genitourinary:       Neg CVAT bilaterally.  Musculoskeletal: She exhibits no edema.  Lymphadenopathy:    She has no cervical adenopathy.  Neurological: She is alert and oriented to person, place, and time.  Skin: Skin is warm and dry.  Psychiatric: She has a normal mood and affect. Her behavior is normal. Judgment and thought content normal.          Assessment & Plan:

## 2012-01-30 NOTE — Assessment & Plan Note (Signed)
Abdominal pain resolved.  Urine culture notes >100K mult morphotypes.  Recommended that pt complete abx, call if symptoms worsen or do not continue to improve.  Pt verbalizes understanding.

## 2012-01-30 NOTE — Patient Instructions (Addendum)
Complete antibiotics. Call if symptoms worsen or if symptoms do not continue to improve.

## 2012-03-08 ENCOUNTER — Ambulatory Visit: Payer: Self-pay | Admitting: Family

## 2012-03-10 ENCOUNTER — Encounter: Payer: Self-pay | Admitting: Family

## 2012-03-10 ENCOUNTER — Ambulatory Visit (INDEPENDENT_AMBULATORY_CARE_PROVIDER_SITE_OTHER): Payer: Self-pay | Admitting: Family

## 2012-03-10 VITALS — BP 130/80 | HR 98 | Temp 97.5°F | Resp 24 | Ht 64.0 in | Wt 166.0 lb

## 2012-03-10 DIAGNOSIS — J329 Chronic sinusitis, unspecified: Secondary | ICD-10-CM | POA: Insufficient documentation

## 2012-03-10 DIAGNOSIS — F3289 Other specified depressive episodes: Secondary | ICD-10-CM

## 2012-03-10 DIAGNOSIS — I10 Essential (primary) hypertension: Secondary | ICD-10-CM

## 2012-03-10 DIAGNOSIS — F32A Depression, unspecified: Secondary | ICD-10-CM

## 2012-03-10 DIAGNOSIS — F329 Major depressive disorder, single episode, unspecified: Secondary | ICD-10-CM

## 2012-03-10 MED ORDER — AMOXICILLIN-POT CLAVULANATE 875-125 MG PO TABS: 1.0000 | ORAL_TABLET | Freq: Two times a day (BID) | ORAL | Status: DC

## 2012-03-10 MED ORDER — LISINOPRIL 5 MG PO TABS: 5.0000 mg | ORAL_TABLET | Freq: Every day | ORAL | Status: DC

## 2012-03-10 NOTE — Progress Notes (Signed)
Subjective:    Patient ID: Diana Glenn, female    DOB: Aug 11, 1955, 55 y.o.   MRN: [id number]  HPI  Diana Glenn is a 56 yr old female who presents today with several concerns:  1) HTN-She continues lisinopril 5mg  once daily.   2) Urinary incontinence- unchanged and stable.    3) Sinus pressure/left ear pressure/cough- has tried benadryl, robitussin, loratidine.  Nasal drainage is yellow. Copious.  4) Depression- [facility] [place].  Continues sertraline.  Psychiatrist told her to stop clonazepam as this may lead to dementia.  She tells me that she has not yet discontinued as she has a lot "left over."  Review of Systems    see HPI  Past Medical History  Diagnosis Date  . Depression   . Diverticulitis   . Hypertension   . Stroke 2007  . Kidney stone   . Urinary incontinence   . Agoraphobia with panic attacks   . Fatty liver     per ultrasound 5/13    History   Social History  . Marital Status: Widowed    Spouse Name: N/A    Number of Children: 4  . Years of Education: N/A   Occupational History  . Not on file.   Social History Main Topics  . Smoking status: Current Every Day Smoker -- 1.5 packs/day for 30 years    Types: Cigarettes  . Smokeless tobacco: Not on file  . Alcohol Use: No  . Drug Use: No  . Sexually Active: Not on file   Other Topics Concern  . Not on file   Social History Narrative   Caffeine Use:  2 cups dailyRegular exercise:  No    Past Surgical History  Procedure Date  . Tonsillectomy 1982  . Tubal ligation 1991  . Uterine ablation 2002    Family History  Problem Relation Age of Onset  . Cancer Sister     lung  . Hypertension Brother   . Cancer Sister     lung  . Hypertension Brother   . Diabetes Brother   . Hypertension Brother   . Diabetes Brother     Allergies  Allergen Reactions  . Bactrim Itching and Rash    Current Outpatient Prescriptions on File Prior to Visit  Medication Sig Dispense  Refill  . aspirin 81 MG chewable tablet Chew 81 mg by mouth daily.        . clonazePAM (KLONOPIN) 1 MG tablet Take 1 mg by mouth 2 (two) times daily as needed.        . sertraline (ZOLOFT) 100 MG tablet Take 1 1/2 tablets once a day.       Marland Kitchen DISCONTD: lisinopril (PRINIVIL,ZESTRIL) 5 MG tablet Take 1 tablet (5 mg total) by mouth daily.  30 tablet  2    BP 130/80  Pulse 98  Temp 97.5 F (36.4 C) (Oral)  Resp 24  Ht 5\' 4"  (1.626 m)  Wt 166 lb (75.297 kg)  BMI 28.49 kg/m2  SpO2 95%  LMP 06/27/2008    Objective:   Physical Exam  Constitutional: She appears well-developed and well-nourished. No distress.  HENT:  Head: Normocephalic and atraumatic.       Bilateral TM's are pink, no bulging noted.   Cardiovascular: Normal rate and regular rhythm.   No murmur heard. Pulmonary/Chest: Effort normal and breath sounds normal. No respiratory distress. She has no wheezes. She has no rales. She exhibits no tenderness.  Psychiatric: She has a normal  mood and affect. Her behavior is normal. Judgment and thought content normal.          Assessment & Plan:

## 2012-03-10 NOTE — Assessment & Plan Note (Signed)
Will rx with augmentin.  

## 2012-03-10 NOTE — Assessment & Plan Note (Signed)
Appears stable, defer management to psychiatry.

## 2012-03-10 NOTE — Patient Instructions (Addendum)
Please complete blood work prior to leaving.  Follow up in 6 months.    Sinusitis Sinusitis is redness, soreness, and swelling (inflammation) of the paranasal sinuses. Paranasal sinuses are air pockets within the bones of your face (beneath the eyes, the middle of the forehead, or above the eyes). In healthy paranasal sinuses, mucus is able to drain out, and air is able to circulate through them by way of your nose. However, when your paranasal sinuses are inflamed, mucus and air can become trapped. This can allow bacteria and other germs to grow and cause infection. Sinusitis can develop quickly and last only a short time (acute) or continue over a long period (chronic). Sinusitis that lasts for more than 12 weeks is considered chronic.  CAUSES  Causes of sinusitis include:  Allergies.  Structural abnormalities, such as displacement of the cartilage that separates your nostrils (deviated septum), which can decrease the air flow through your nose and sinuses and affect sinus drainage.  Functional abnormalities, such as when the small hairs (cilia) that line your sinuses and help remove mucus do not work properly or are not present. SYMPTOMS  Symptoms of acute and chronic sinusitis are the same. The primary symptoms are pain and pressure around the affected sinuses. Other symptoms include:  Upper toothache.  Earache.  Headache.  Bad breath.  Decreased sense of smell and taste.  A cough, which worsens when you are lying flat.  Fatigue.  Fever.  Thick drainage from your nose, which often is green and may contain pus (purulent).  Swelling and warmth over the affected sinuses. DIAGNOSIS  Your caregiver will perform a physical exam. During the exam, your caregiver may:  Look in your nose for signs of abnormal growths in your nostrils (nasal polyps).  Tap over the affected sinus to check for signs of infection.  View the inside of your sinuses (endoscopy) with a special imaging  device with a light attached (endoscope), which is inserted into your sinuses. If your caregiver suspects that you have chronic sinusitis, one or more of the following tests may be recommended:  Allergy tests.  Nasal culture A sample of mucus is taken from your nose and sent to a lab and screened for bacteria.  Nasal cytology A sample of mucus is taken from your nose and examined by your caregiver to determine if your sinusitis is related to an allergy. TREATMENT  Most cases of acute sinusitis are related to a viral infection and will resolve on their own within 10 days. Sometimes medicines are prescribed to help relieve symptoms (pain medicine, decongestants, nasal steroid sprays, or saline sprays).  However, for sinusitis related to a bacterial infection, your caregiver will prescribe antibiotic medicines. These are medicines that will help kill the bacteria causing the infection.  Rarely, sinusitis is caused by a fungal infection. In theses cases, your caregiver will prescribe antifungal medicine. For some cases of chronic sinusitis, surgery is needed. Generally, these are cases in which sinusitis recurs more than 3 times per year, despite other treatments. HOME CARE INSTRUCTIONS   Drink plenty of water. Water helps thin the mucus so your sinuses can drain more easily.  Use a humidifier.  Inhale steam 3 to 4 times a day (for example, sit in the bathroom with the shower running).  Apply a warm, moist washcloth to your face 3 to 4 times a day, or as directed by your caregiver.  Use saline nasal sprays to help moisten and clean your sinuses.  Take over-the-counter or  prescription medicines for pain, discomfort, or fever only as directed by your caregiver. SEEK IMMEDIATE MEDICAL CARE IF:  You have increasing pain or severe headaches.  You have nausea, vomiting, or drowsiness.  You have swelling around your face.  You have vision problems.  You have a stiff neck.  You have  difficulty breathing. MAKE SURE YOU:   Understand these instructions.  Will watch your condition.  Will get help right away if you are not doing well or get worse. Document Released: 05/19/2005 Document Revised: 08/11/2011 Document Reviewed: 06/03/2011 [facility] Patient Information 2013 [place], [place].

## 2012-03-10 NOTE — Assessment & Plan Note (Signed)
BP stable on lisinopril. Continue same.  

## 2012-03-11 ENCOUNTER — Encounter: Payer: Self-pay | Admitting: Family

## 2012-03-11 LAB — BASIC METABOLIC PANEL
Chloride: 104 mEq/L (ref 96–112)
Potassium: 4.6 mEq/L (ref 3.5–5.3)

## 2012-09-09 ENCOUNTER — Other Ambulatory Visit: Payer: Self-pay | Admitting: Family

## 2012-09-09 NOTE — Telephone Encounter (Signed)
Rx request to pharmacy; #30x0--Office Visit Needed Prior to Future Refills/SLS

## 2012-09-10 ENCOUNTER — Other Ambulatory Visit: Payer: Self-pay | Admitting: Family

## 2012-09-10 NOTE — Telephone Encounter (Signed)
Denial--duplicate request/SLS

## 2012-10-19 ENCOUNTER — Other Ambulatory Visit: Payer: Self-pay | Admitting: Family

## 2012-10-19 NOTE — Telephone Encounter (Signed)
Called patient and advised her to call to schedule office visit within the next thirty days. Patient agreed to make appointment.

## 2012-10-19 NOTE — Telephone Encounter (Signed)
Can give 30 day supply only, needs OV prior to additional refills.

## 2012-10-19 NOTE — Telephone Encounter (Signed)
Pt was due for follow up in April and has no appts on file. Pt is requesting 90 day supply of lisinopril.  Please advise.

## 2012-11-22 ENCOUNTER — Encounter: Payer: Self-pay | Admitting: Family

## 2012-11-22 ENCOUNTER — Ambulatory Visit (INDEPENDENT_AMBULATORY_CARE_PROVIDER_SITE_OTHER): Payer: Self-pay | Admitting: Family

## 2012-11-22 ENCOUNTER — Telehealth: Payer: Self-pay | Admitting: *Deleted

## 2012-11-22 VITALS — BP 130/90 | HR 87 | Temp 97.7°F | Resp 16 | Wt 165.0 lb

## 2012-11-22 DIAGNOSIS — R32 Unspecified urinary incontinence: Secondary | ICD-10-CM

## 2012-11-22 DIAGNOSIS — F3289 Other specified depressive episodes: Secondary | ICD-10-CM

## 2012-11-22 DIAGNOSIS — I10 Essential (primary) hypertension: Secondary | ICD-10-CM

## 2012-11-22 DIAGNOSIS — F329 Major depressive disorder, single episode, unspecified: Secondary | ICD-10-CM

## 2012-11-22 DIAGNOSIS — F32A Depression, unspecified: Secondary | ICD-10-CM

## 2012-11-22 DIAGNOSIS — J069 Acute upper respiratory infection, unspecified: Secondary | ICD-10-CM | POA: Insufficient documentation

## 2012-11-22 DIAGNOSIS — N390 Urinary tract infection, site not specified: Secondary | ICD-10-CM

## 2012-11-22 MED ORDER — LISINOPRIL 5 MG PO TABS: 5.0000 mg | ORAL_TABLET | Freq: Every day | ORAL | Status: DC

## 2012-11-22 NOTE — Progress Notes (Signed)
Subjective:    Patient ID: Diana Glenn, female    DOB: Aug 21, 1955, 57 y.o.   MRN: [id number]  HPI  Diana Glenn is a 57 yr old female who presents today for follow up.  1) HTN- She reports that she had an elevated BP when she went to mental health last week.  150/92.  She has a bp cuff at home but does not check her bp routinely.  She reports that she was having some stomach pains that day.    2) Depression- She saw psychiatrist who recommended that she start therapy.  She does not wish to see a therapist.    3) Urinary incontinence- this is an ongoing issue.  Declines OAB medication at this time.   4) reports "cold."  Woke up with head congestion/chest congestion on Friday 6/20.    Review of Systems See HPI  Past Medical History  Diagnosis Date  . Depression   . Diverticulitis   . Hypertension   . Stroke 2007  . Kidney stone   . Urinary incontinence   . Agoraphobia with panic attacks   . Fatty liver     per ultrasound 5/13    History   Social History  . Marital Status: Widowed    Spouse Name: N/A    Number of Children: 4  . Years of Education: N/A   Occupational History  . Not on file.   Social History Main Topics  . Smoking status: Current Every Day Smoker -- 1.50 packs/day for 30 years    Types: Cigarettes  . Smokeless tobacco: Not on file  . Alcohol Use: No  . Drug Use: No  . Sexually Active: Not on file   Other Topics Concern  . Not on file   Social History Narrative   Caffeine Use:  2 cups daily   Regular exercise:  No          Past Surgical History  Procedure Laterality Date  . Tonsillectomy  1982  . Tubal ligation  1991  . Uterine ablation  2002    Family History  Problem Relation Age of Onset  . Cancer Sister     lung  . Hypertension Brother   . Cancer Sister     lung  . Hypertension Brother   . Diabetes Brother   . Hypertension Brother   . Diabetes Brother     Allergies  Allergen Reactions  . Bactrim Itching and Rash     Current Outpatient Prescriptions on File Prior to Visit  Medication Sig Dispense Refill  . aspirin 81 MG chewable tablet Chew 81 mg by mouth daily.        . clonazePAM (KLONOPIN) 1 MG tablet Take 1 mg by mouth 2 (two) times daily as needed.        Marland Kitchen lisinopril (PRINIVIL,ZESTRIL) 5 MG tablet Take 1 tablet (5 mg total) by mouth daily.  30 tablet  0  . sertraline (ZOLOFT) 100 MG tablet Take 1 1/2 tablets once a day.        No current facility-administered medications on file prior to visit.    BP 130/90  Pulse 87  Temp(Src) 97.7 F (36.5 C) (Oral)  Resp 16  Wt 165 lb (74.844 kg)  BMI 28.31 kg/m2  SpO2 97%  LMP 06/27/2008       Objective:   Physical Exam  Constitutional: She is oriented to person, place, and time. She appears well-developed and well-nourished. No distress.  HENT:  Head: Normocephalic and atraumatic.  Right Ear: Tympanic membrane and ear canal normal.  Left Ear: Tympanic membrane and ear canal normal.  Mouth/Throat: No oropharyngeal exudate, posterior oropharyngeal edema or posterior oropharyngeal erythema.  Cardiovascular: Normal rate and regular rhythm.   No murmur heard. Pulmonary/Chest: Effort normal and breath sounds normal. No respiratory distress. She has no wheezes. She has no rales. She exhibits no tenderness.  Musculoskeletal: She exhibits no edema.  Neurological: She is alert and oriented to person, place, and time.  Psychiatric: She has a normal mood and affect. Her behavior is normal. Judgment and thought content normal.          Assessment & Plan:

## 2012-11-22 NOTE — Assessment & Plan Note (Signed)
Unchanged.  Pt declines rx with overactive bladder meds at this time.

## 2012-11-22 NOTE — Telephone Encounter (Signed)
lisinopril (PRINIVIL,ZESTRIL) 5 MG tablet 90 tablet 1 11/22/2012 Sig - Route: Take 1 tablet (5 mg total) by mouth daily. - Oral Notes to Pharmacy: PATIENT IS REQUESTING A 90 DAY SUPPLY E-Prescribing Status: Transmission to pharmacy failed (11/22/2012 1:31 PM EDT)  Electronic transmission failed for above Rx and was called to pharmacy voicemail.

## 2012-11-22 NOTE — Assessment & Plan Note (Signed)
BP looks good here today. Continue current dose of lisinopril.   Obtain BMET.

## 2012-11-22 NOTE — Assessment & Plan Note (Signed)
Fair control.  This is being managed by psychiatry.  I did encourage her to consider meeting with a therapist.

## 2012-11-22 NOTE — Patient Instructions (Addendum)
Please complete your lab work prior to leaving. Call if cold symptoms worsen, if you develop fever >101, or if you are not feeling better in 3-4 days.  Please schedule a fasting physical at your convenience.

## 2012-11-22 NOTE — Assessment & Plan Note (Addendum)
Recommended trial of claritin.  Advised pt to call if symptoms worsen or if not improved in 3-4 days.

## 2012-11-23 ENCOUNTER — Encounter: Payer: Self-pay | Admitting: Family

## 2012-11-23 LAB — BASIC METABOLIC PANEL
BUN: 21 mg/dL (ref 6–23)
Potassium: 4.8 mEq/L (ref 3.5–5.3)

## 2013-05-23 ENCOUNTER — Other Ambulatory Visit: Payer: Self-pay | Admitting: Family

## 2013-05-23 NOTE — Telephone Encounter (Signed)
Rx request to pharmacy; Patient Needs Appointment Prior to Future Refills/SLS  

## 2013-05-24 ENCOUNTER — Other Ambulatory Visit: Payer: Self-pay | Admitting: Family

## 2013-05-24 NOTE — Telephone Encounter (Signed)
30 tabs sent to pharmacy. Due for follow up please.

## 2013-05-25 NOTE — Telephone Encounter (Signed)
Please call pt to arrange appt. 

## 2013-05-25 NOTE — Telephone Encounter (Signed)
Could not reach patient at either #, will try again later °

## 2013-06-06 NOTE — Telephone Encounter (Signed)
Could not reach patient at either #, will try again later

## 2013-06-08 NOTE — Telephone Encounter (Signed)
Informed patient of medication refill and she states that she will call us to schedule appointment before she runs out of medication

## 2013-08-23 ENCOUNTER — Ambulatory Visit: Payer: Self-pay | Admitting: Family

## 2013-08-26 ENCOUNTER — Encounter: Payer: Self-pay | Admitting: Family

## 2013-08-26 ENCOUNTER — Ambulatory Visit (INDEPENDENT_AMBULATORY_CARE_PROVIDER_SITE_OTHER): Payer: Self-pay | Admitting: Family

## 2013-08-26 ENCOUNTER — Telehealth: Payer: Self-pay | Admitting: Family

## 2013-08-26 VITALS — BP 116/78 | HR 88 | Temp 97.9°F | Resp 18 | Ht 64.0 in | Wt 163.0 lb

## 2013-08-26 DIAGNOSIS — F3289 Other specified depressive episodes: Secondary | ICD-10-CM

## 2013-08-26 DIAGNOSIS — F32A Depression, unspecified: Secondary | ICD-10-CM

## 2013-08-26 DIAGNOSIS — J309 Allergic rhinitis, unspecified: Secondary | ICD-10-CM | POA: Insufficient documentation

## 2013-08-26 DIAGNOSIS — F329 Major depressive disorder, single episode, unspecified: Secondary | ICD-10-CM

## 2013-08-26 DIAGNOSIS — I10 Essential (primary) hypertension: Secondary | ICD-10-CM

## 2013-08-26 MED ORDER — SERTRALINE HCL 100 MG PO TABS: 150.0000 mg | ORAL_TABLET | Freq: Every day | ORAL | Status: DC

## 2013-08-26 MED ORDER — LISINOPRIL 5 MG PO TABS: ORAL_TABLET | ORAL | Status: DC

## 2013-08-26 NOTE — Progress Notes (Signed)
Pre visit review using our clinic review tool, if applicable. No additional management support is needed unless otherwise documented below in the visit note. 

## 2013-08-26 NOTE — Progress Notes (Signed)
   Subjective:    Patient ID: Diana Glenn, female    DOB: 05/07/1956, 58 y.o.   MRN: [id number]  HPI  Diana Glenn is a 58 yr old female who presents today for follow up.  HTN- on lisinopril 10mg  + baby asa. Denies Cp, sob, swelling  Anxiety/Depression- reports that depression is well controlled. Notes occasional anxiety.    Some sinus drainage/pressure- uses occasional benadryl.    Review of Systems See HPI  Past Medical History  Diagnosis Date  . Depression   . Diverticulitis   . Hypertension   . Stroke 2007  . Kidney stone   . Urinary incontinence   . Agoraphobia with panic attacks   . Fatty liver     per ultrasound 5/13    History   Social History  . Marital Status: Widowed    Spouse Name: N/A    Number of Children: 4  . Years of Education: N/A   Occupational History  . Not on file.   Social History Main Topics  . Smoking status: Current Every Day Smoker -- 1.50 packs/day for 30 years    Types: Cigarettes  . Smokeless tobacco: Not on file  . Alcohol Use: No  . Drug Use: No  . Sexual Activity: Not on file   Other Topics Concern  . Not on file   Social History Narrative   Caffeine Use:  2 cups daily   Regular exercise:  No          Past Surgical History  Procedure Laterality Date  . Tonsillectomy  1982  . Tubal ligation  1991  . Uterine ablation  2002    Family History  Problem Relation Age of Onset  . Cancer Sister     lung  . Hypertension Brother   . Cancer Sister     lung  . Hypertension Brother   . Diabetes Brother   . Hypertension Brother   . Diabetes Brother     Allergies  Allergen Reactions  . Bactrim Itching and Rash    Current Outpatient Prescriptions on File Prior to Visit  Medication Sig Dispense Refill  . aspirin 81 MG chewable tablet Chew 81 mg by mouth daily.        Marland Kitchen. lisinopril (PRINIVIL,ZESTRIL) 5 MG tablet TAKE ONE TABLET BY MOUTH ONCE DAILY  30 tablet  0  . sertraline (ZOLOFT) 100 MG tablet Take 1 1/2 tablets  once a day.        No current facility-administered medications on file prior to visit.    BP 116/78  Pulse 88  Temp(Src) 97.9 F (36.6 C) (Oral)  Resp 18  Ht 5\' 4"  (1.626 m)  Wt 163 lb (73.936 kg)  BMI 27.97 kg/m2  SpO2 97%  LMP 06/27/2008       Objective:   Physical Exam  Constitutional: She is oriented to person, place, and time. She appears well-developed and well-nourished. No distress.  HENT:  Head: Normocephalic and atraumatic.  Cardiovascular: Normal rate and regular rhythm.   No murmur heard. Pulmonary/Chest: Effort normal and breath sounds normal. No respiratory distress. She has no wheezes. She has no rales. She exhibits no tenderness.  Musculoskeletal: She exhibits no edema.  Neurological: She is alert and oriented to person, place, and time.  Psychiatric: She has a normal mood and affect. Her behavior is normal. Judgment and thought content normal.          Assessment & Plan:

## 2013-08-26 NOTE — Assessment & Plan Note (Signed)
Trial of claritin 

## 2013-08-26 NOTE — Telephone Encounter (Signed)
Relevant patient education mailed to patient.  

## 2013-08-26 NOTE — Patient Instructions (Signed)
Please complete lab work prior to leaving. Schedule fasting physical in the next 1-2 months.

## 2013-08-26 NOTE — Assessment & Plan Note (Signed)
Stable on sertraline, continue same.  ?

## 2013-08-26 NOTE — Assessment & Plan Note (Signed)
BP stable, continue lisinopril.  

## 2013-08-27 ENCOUNTER — Telehealth: Payer: Self-pay | Admitting: Family

## 2013-08-27 NOTE — Telephone Encounter (Signed)
Relevant patient education mailed to patient.  

## 2013-10-04 ENCOUNTER — Ambulatory Visit: Payer: Self-pay | Admitting: Family

## 2013-10-04 DIAGNOSIS — Z0289 Encounter for other administrative examinations: Secondary | ICD-10-CM

## 2014-02-27 ENCOUNTER — Encounter: Payer: Self-pay | Admitting: Family

## 2014-02-27 ENCOUNTER — Ambulatory Visit (INDEPENDENT_AMBULATORY_CARE_PROVIDER_SITE_OTHER): Payer: Self-pay | Admitting: Family

## 2014-02-27 VITALS — BP 114/79 | HR 95 | Temp 98.0°F | Resp 16 | Ht 64.0 in | Wt 151.4 lb

## 2014-02-27 DIAGNOSIS — F419 Anxiety disorder, unspecified: Secondary | ICD-10-CM

## 2014-02-27 DIAGNOSIS — E039 Hypothyroidism, unspecified: Secondary | ICD-10-CM

## 2014-02-27 DIAGNOSIS — N200 Calculus of kidney: Secondary | ICD-10-CM

## 2014-02-27 DIAGNOSIS — F411 Generalized anxiety disorder: Secondary | ICD-10-CM

## 2014-02-27 DIAGNOSIS — E876 Hypokalemia: Secondary | ICD-10-CM

## 2014-02-27 MED ORDER — CEPHALEXIN 500 MG PO CAPS: 500.0000 mg | ORAL_CAPSULE | Freq: Two times a day (BID) | ORAL | Status: DC

## 2014-02-27 MED ORDER — LISINOPRIL 5 MG PO TABS: ORAL_TABLET | ORAL | Status: DC

## 2014-02-27 MED ORDER — ALPRAZOLAM 0.5 MG PO TABS: 0.5000 mg | ORAL_TABLET | Freq: Two times a day (BID) | ORAL | Status: AC | PRN

## 2014-02-27 NOTE — Progress Notes (Signed)
Pre visit review using our clinic review tool, if applicable. No additional management support is needed unless otherwise documented below in the visit note. 

## 2014-02-27 NOTE — Progress Notes (Signed)
Subjective:    Patient ID: Diana Glenn, female    DOB: 04-18-1956, 58 y.o.   MRN: [id number]  HPI  [name] is a 58 yr old female who presents today with several concerns:  Diverticulitis- reports that this occured 4-5  Months ago and that she was hospitalized at [facility]. She did not require any surgical intervention.  Nephrolithiasis- Reports that she had left flank pain and went to the ED at [facility]. Was told kidney stones and pyelonephritis.  Urology was consulted and was told that she needed a ureteral stent placed.  Reports that she developed pulmonary edema due to volume overload while in the hospital.  Was treated with lasix.  Had a V/Q scan on her lungs, neg for PE per patient. Had nuclear scan left kidney- showed 20% function left kidney per pt.  Dr. Sabino Gasser is following her.  Reports that her follow scan shows 11% left kidney function. Reports that she was to start the macrobid after the cipro was discontinued to prevent infection. She reports that she has not started the macrobid because she is uninsured and it is too expensive. Due to severity of kidney stones and atrophy of kidney she is told that she needs a left nephrectomy.  Anxiety- She reports anxiety symptoms have worsened since she learned that she needs nephrectomy. Not sleeping. Trouble sitting still.    Pt reports increased anxiety x 6 weeks since hearing about need to remove kidney. Can only sleep about 2 hours at a time.   Review of Systems See HPI  Past Medical History  Diagnosis Date  . Depression   . Diverticulitis   . Hypertension   . Stroke 2007  . Kidney stone   . Urinary incontinence   . Agoraphobia with panic attacks   . Fatty liver     per ultrasound 5/13    History   Social History  . Marital Status: Widowed    Spouse Name: N/A    Number of Children: 4  . Years of Education: N/A   Occupational History  . Not on file.   Social History Main Topics  . Smoking status: Current  Every Day Smoker -- 1.50 packs/day for 30 years    Types: Cigarettes  . Smokeless tobacco: Not on file  . Alcohol Use: No  . Drug Use: No  . Sexual Activity: Not on file   Other Topics Concern  . Not on file   Social History Narrative   Caffeine Use:  2 cups daily   Regular exercise:  No          Past Surgical History  Procedure Laterality Date  . Tonsillectomy  1982  . Tubal ligation  1991  . Uterine ablation  2002    Family History  Problem Relation Age of Onset  . Cancer Sister     lung  . Hypertension Brother   . Cancer Sister     lung  . Hypertension Brother   . Diabetes Brother   . Hypertension Brother   . Diabetes Brother     Allergies  Allergen Reactions  . Cortisone Other (See Comments)    Excitability, blurred vision  . Bactrim Itching and Rash    Current Outpatient Prescriptions on File Prior to Visit  Medication Sig Dispense Refill  . aspirin 81 MG chewable tablet Chew 81 mg by mouth daily.        Marland Kitchen loratadine (CLARITIN) 10 MG tablet Take 10 mg by mouth daily  as needed for allergies.      Marland Kitchen sertraline (ZOLOFT) 100 MG tablet Take 1.5 tablets (150 mg total) by mouth daily. Take 1 1/2 tablets once a day.  135 tablet  1   No current facility-administered medications on file prior to visit.    BP 114/79  Pulse 95  Temp(Src) 98 F (36.7 C) (Oral)  Resp 16  Ht  (1.626 m)  Wt 151 lb 6.4 oz (68.675 kg)  BMI 25.98 kg/m2  SpO2 95%  LMP 06/27/2008       Objective:   Physical Exam  Constitutional: She is oriented to person, place, and time. She appears well-developed and well-nourished. No distress.  Cardiovascular: Normal rate and regular rhythm.   No murmur heard. Pulmonary/Chest: Effort normal and breath sounds normal. No respiratory distress. She has no wheezes. She has no rales. She exhibits no tenderness.  Neurological: She is alert and oriented to person, place, and time.  Psychiatric: Her speech is normal and behavior is normal.  Her mood appears anxious. Thought content is paranoid. Cognition and memory are normal.  Tearful at times          Assessment & Plan:

## 2014-02-27 NOTE — Patient Instructions (Addendum)
Please complete lab work prior to leaving. Follow up in 2-3 weeks for surgical clearance.   Contact the [facility] [employer] office to apply for patient assistance.  [phone]

## 2014-02-28 LAB — BASIC METABOLIC PANEL
BUN: 19 mg/dL (ref 6–23)
CHLORIDE: 103 meq/L (ref 96–112)
CO2: 27 mEq/L (ref 19–32)
Calcium: 9.6 mg/dL (ref 8.4–10.5)
Creatinine, Ser: 1.3 mg/dL — ABNORMAL HIGH (ref 0.4–1.2)
GFR: 43.83 mL/min — AB (ref >60.00)
GLUCOSE: 104 mg/dL — AB (ref 70–99)
Potassium: 4.2 mEq/L (ref 3.5–5.1)
Sodium: 137 mEq/L (ref 135–145)

## 2014-02-28 LAB — TSH: TSH: 2.51 u[IU]/mL (ref 0.35–4.50)

## 2014-03-01 ENCOUNTER — Telehealth: Payer: Self-pay | Admitting: Family

## 2014-03-01 DIAGNOSIS — N200 Calculus of kidney: Secondary | ICD-10-CM | POA: Insufficient documentation

## 2014-03-01 NOTE — Assessment & Plan Note (Signed)
She is following with Dr. Sabino GasserMullins for recommended nephrectomy.  Management per urology.

## 2014-03-01 NOTE — Telephone Encounter (Signed)
Could you please contact Dr. Johnn HaiMullin's nurse and ask if they need a formal medical clearance for her upcoming surgery. Please also let them know that she could not afford macrobid.  I gave her a 1 week rx for keflex but will defer additional refills to Dr. Sabino GasserMullins.

## 2014-03-01 NOTE — Telephone Encounter (Signed)
Left detailed message on Dr Sabino GasserMullins' nurse voicemail at [phone] re: below information and requested return call.

## 2014-03-01 NOTE — Assessment & Plan Note (Signed)
Deteriorated, add xanax sparingly short term.  Controlled substance contract is signed today.

## 2014-03-02 ENCOUNTER — Encounter: Payer: Self-pay | Admitting: Family

## 2014-03-13 ENCOUNTER — Telehealth: Payer: Self-pay | Admitting: Family

## 2014-03-13 NOTE — Telephone Encounter (Signed)
Caller name: Kriste BasqueBecky Relation to pt: Dr. Sabino GasserMullins nurse Call back number: [phone]   Reason for call:  Becky returned call.  States that patient does need surgical clearance from us; does not have an answer to the questions regarding the Keflex.

## 2014-03-13 NOTE — Telephone Encounter (Signed)
Notified pt. She states Dr Sabino GasserMullins office called and are going to continue Keflex 200mg  daily until she has surgery. Pt is supposed to call urologist office on 03/16/14 to be "put down on the books for surgery". Pt has scheduled surgical clearance with us on 03/29/14. Pt asked if it was ok to continue off of thyroid medication that she was on in the hospital for reported "hypothyroidism". Per verbal from Provider, hospital records indicated normal TSH and no record of thyroid medication so ok to continue off at this time.

## 2014-03-13 NOTE — Telephone Encounter (Signed)
Spoke with pt. She states she contacted urologist for refill and she states Dr Sabino GasserMullins is out of town until the last week of October and they have not responded to request. Pt is wanting to know if we will refill medication?  Attempted to reach Dr Sabino GasserMullins' nurse again and left detailed message re: surgical clearance and keflex refill.

## 2014-03-13 NOTE — Telephone Encounter (Signed)
See 03/13/14 phone note.

## 2014-03-13 NOTE — Telephone Encounter (Signed)
Caller name: Bertha StakesCashatt, Kiannah Relation to pt: self  Call back number: [phone] [facility] [phone]   Reason for call: pt requesting a refill of cephALEXin (KEFLEX) 500 MG capsule

## 2014-03-13 NOTE — Telephone Encounter (Signed)
I spoke to pharmacy and Dr. Sabino GasserMullins sent a 7 day prescription initially, so I do not think she needs a refill on the keflex. She will need to book a surgical clearance visit please.

## 2014-03-29 ENCOUNTER — Ambulatory Visit (HOSPITAL_BASED_OUTPATIENT_CLINIC_OR_DEPARTMENT_OTHER)
Admission: RE | Admit: 2014-03-29 | Discharge: 2014-03-29 | Disposition: A | Discharge status: Home / Self Care | Payer: Self-pay | Source: Ambulatory Visit | Attending: Family | Admitting: Family

## 2014-03-29 ENCOUNTER — Ambulatory Visit (INDEPENDENT_AMBULATORY_CARE_PROVIDER_SITE_OTHER): Payer: Self-pay | Admitting: Family

## 2014-03-29 ENCOUNTER — Encounter: Payer: Self-pay | Admitting: Family

## 2014-03-29 VITALS — BP 100/68 | HR 78 | Temp 98.0°F | Resp 16 | Ht 64.0 in | Wt 151.0 lb

## 2014-03-29 DIAGNOSIS — N200 Calculus of kidney: Secondary | ICD-10-CM

## 2014-03-29 DIAGNOSIS — Z01818 Encounter for other preprocedural examination: Secondary | ICD-10-CM

## 2014-03-29 DIAGNOSIS — I10 Essential (primary) hypertension: Secondary | ICD-10-CM | POA: Insufficient documentation

## 2014-03-29 DIAGNOSIS — Z23 Encounter for immunization: Secondary | ICD-10-CM

## 2014-03-29 NOTE — Progress Notes (Signed)
Subjective:    Patient ID: Diana Glenn, female    DOB: 04/13/1956, 58 y.o.   MRN: [id number]  HPI  Ms. Diana Glenn is a 58 year old female who presents today for evaluation before surgery.   In September she was hospitalized for pyelonephritis and was seen by urology.  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  Following a lasix scan, her kidney function was determined to be 11%.  Her hospital course was complicated by pulmonary edema and she was seen by Pulmonology.  She underwent VQ scan in the hospital which showed no evidence of pulmonary embolus.  Pulmonary edema resolved with lasix.  Dr. Sabino GasserMullins of [facility] has scheduled her for a left nephrectomy on Monday November 2nd.  Dr. Sabino GasserMullins advised her to see Pulmonology outpatient before surgery, which she has done.     Review of Systems  Constitutional: Negative for fever, chills, diaphoresis, appetite change and fatigue.  HENT: Positive for congestion and sinus pressure. Negative for ear pain, facial swelling, hearing loss, postnasal drip, rhinorrhea, sneezing, sore throat, trouble swallowing and voice change.   Eyes: Positive for discharge. Negative for pain and redness.  Respiratory: Positive for cough. Negative for apnea, chest tightness, shortness of breath and wheezing.   Cardiovascular: Negative for chest pain, palpitations and leg swelling.  Gastrointestinal: Positive for nausea, abdominal pain and diarrhea. Negative for vomiting, constipation and blood in stool.  Endocrine: Negative for polydipsia, polyphagia and polyuria.  Genitourinary: Positive for urgency, frequency and flank pain. Negative for dysuria, hematuria, vaginal bleeding, vaginal discharge, difficulty urinating and vaginal pain.  Musculoskeletal: Negative for gait problem, joint swelling, neck pain and neck stiffness.  Skin: Negative for color change, pallor, rash and wound.       Reports  skin dryness  Allergic/Immunologic: Negative for food allergies.  Neurological: Positive for light-headedness. Negative for dizziness, syncope, weakness and headaches.  Hematological: Bruises/bleeds easily.  Psychiatric/Behavioral: Positive for sleep disturbance. Negative for hallucinations, confusion and decreased concentration. The patient is nervous/anxious.    Past Medical History  Diagnosis Date  . Depression   . Diverticulitis   . Hypertension   . Stroke 2007  . Kidney stone   . Urinary incontinence   . Agoraphobia with panic attacks   . Fatty liver     per ultrasound 5/13    History   Social History  . Marital Status: Widowed    Spouse Name: N/A    Number of Children: 4  . Years of Education: N/A   Occupational History  . Not on file.   Social History Main Topics  . Smoking status: Current Every Day Smoker -- 1.50 packs/day for 30 years    Types: Cigarettes  . Smokeless tobacco: Not on file  . Alcohol Use: No  . Drug Use: No  . Sexual Activity: Not on file   Other Topics Concern  . Not on file   Social History Narrative   Caffeine Use:  2 cups daily   Regular exercise:  No          Past Surgical History  Procedure Laterality Date  . Tonsillectomy  1982  . Tubal ligation  1991  . Uterine ablation  2002    Family History  Problem Relation Age of Onset  . Cancer Sister     lung  . Hypertension Brother   . Cancer Sister     lung  . Hypertension Brother   .  Diabetes Brother   . Hypertension Brother   . Diabetes Brother     Allergies  Allergen Reactions  . Cortisone Other (See Comments)    Excitability, blurred vision  . Bactrim Itching and Rash    Current Outpatient Prescriptions on File Prior to Visit  Medication Sig Dispense Refill  . ALPRAZolam (XANAX) 0.5 MG tablet Take 1 tablet (0.5 mg total) by mouth 2 (two) times daily as needed for anxiety.  30 tablet  0  . cephALEXin (KEFLEX) 500 MG capsule Take 1 capsule (500 mg total) by mouth  2 (two) times daily.  14 capsule  0  . lisinopril (PRINIVIL,ZESTRIL) 5 MG tablet TAKE ONE TABLET BY MOUTH ONCE DAILY  30 tablet  0  . sertraline (ZOLOFT) 100 MG tablet Take 1.5 tablets (150 mg total) by mouth daily. Take 1 1/2 tablets once a day.  135 tablet  1  . Albuterol Sulfate 108 (90 BASE) MCG/ACT AEPB Inhale into the lungs.      Marland Kitchen. aspirin 81 MG chewable tablet Chew 81 mg by mouth daily.        . Fluticasone-Salmeterol (ADVAIR DISKUS IN) Inhale into the lungs.       No current facility-administered medications on file prior to visit.    BP 100/68  Pulse 78  Temp(Src) 98 F (36.7 C) (Oral)  Resp 16  Ht 5\' 4"  (1.626 m)  Wt 151 lb (68.493 kg)  BMI 25.91 kg/m2  SpO2 97%  LMP 06/27/2008       Objective:   Physical Exam  Constitutional: She is oriented to person, place, and time. She appears well-developed and well-nourished.  HENT:  Head: Normocephalic and atraumatic.  Right Ear: External ear normal.  Left Ear: External ear normal.  Eyes: Pupils are equal, round, and reactive to light.  Neck: Normal range of motion. Neck supple. No JVD present. No thyromegaly present.  Cardiovascular: Normal rate, regular rhythm, normal heart sounds and intact distal pulses.   No murmur heard. Pulmonary/Chest: Effort normal and breath sounds normal. No respiratory distress. She has no wheezes. She has no rales. She exhibits no tenderness.  Abdominal: Soft. Bowel sounds are normal. She exhibits no distension. There is no tenderness. There is no rebound and no guarding.  Musculoskeletal: Normal range of motion. She exhibits no edema.  Neurological: She is alert and oriented to person, place, and time.  Skin: Skin is warm and dry. No rash noted. She is not diaphoretic. No erythema.  Psychiatric: She has a normal mood and affect.          Assessment & Plan:  CBC, CMET, UA and culture, PT/PTT/INR, EKG and Chest Xray to be done today.   I have personally seen and examined patient and  agree with Suzzette RighterErin Smith NP student's assessment and plan. Sandford CrazeMelissa O'Sullivan NP

## 2014-03-29 NOTE — Patient Instructions (Signed)
Please complete lab work prior to leaving. Complete x-ray on the first floor.  

## 2014-03-29 NOTE — Progress Notes (Signed)
Pre visit review using our clinic review tool, if applicable. No additional management support is needed unless otherwise documented below in the visit note. 

## 2014-03-30 ENCOUNTER — Telehealth: Payer: Self-pay | Admitting: Family

## 2014-03-30 DIAGNOSIS — N200 Calculus of kidney: Secondary | ICD-10-CM | POA: Insufficient documentation

## 2014-03-30 LAB — CBC WITH DIFFERENTIAL/PLATELET
Basophils Absolute: 0.1 10*3/uL (ref 0.0–0.1)
Basophils Relative: 0.9 % (ref 0.0–3.0)
EOS PCT: 0.8 % (ref 0.0–5.0)
Eosinophils Absolute: 0.1 10*3/uL (ref 0.0–0.7)
HCT: 42.8 % (ref 36.0–46.0)
Hemoglobin: 14.2 g/dL (ref 12.0–15.0)
LYMPHS PCT: 34.2 % (ref 12.0–46.0)
Lymphs Abs: 2.5 10*3/uL (ref 0.7–4.0)
MCHC: 33.2 g/dL (ref 30.0–36.0)
MCV: 90 fl (ref 78.0–100.0)
MONO ABS: 0.3 10*3/uL (ref 0.1–1.0)
Monocytes Relative: 3.6 % (ref 3.0–12.0)
NEUTROS PCT: 60.5 % (ref 43.0–77.0)
Neutro Abs: 4.4 10*3/uL (ref 1.4–7.7)
PLATELETS: 253 10*3/uL (ref 150.0–400.0)
RBC: 4.75 Mil/uL (ref 3.87–5.11)
RDW: 13.1 % (ref 11.5–15.5)
WBC: 7.3 10*3/uL (ref 4.0–10.5)

## 2014-03-30 LAB — PROTIME-INR
INR: 1 ratio (ref 0.8–1.0)
PROTHROMBIN TIME: 11.3 s (ref 9.6–13.1)

## 2014-03-30 LAB — URINALYSIS, ROUTINE W REFLEX MICROSCOPIC
BILIRUBIN URINE: NEGATIVE
KETONES UR: NEGATIVE
Nitrite: NEGATIVE
Total Protein, Urine: NEGATIVE
URINE GLUCOSE: NEGATIVE
UROBILINOGEN UA: 0.2 (ref 0.0–1.0)
pH: 5.5 (ref 5.0–8.0)

## 2014-03-30 LAB — HEPATIC FUNCTION PANEL
ALBUMIN: 3.8 g/dL (ref 3.5–5.2)
ALK PHOS: 85 U/L (ref 39–117)
ALT: 10 U/L (ref 0–35)
AST: 12 U/L (ref 0–37)
BILIRUBIN DIRECT: 0 mg/dL (ref 0.0–0.3)
TOTAL PROTEIN: 7.5 g/dL (ref 6.0–8.3)
Total Bilirubin: 0.6 mg/dL (ref 0.2–1.2)

## 2014-03-30 LAB — BASIC METABOLIC PANEL
BUN: 15 mg/dL (ref 6–23)
CHLORIDE: 105 meq/L (ref 96–112)
CO2: 23 meq/L (ref 19–32)
Calcium: 9.2 mg/dL (ref 8.4–10.5)
Creatinine, Ser: 1 mg/dL (ref 0.4–1.2)
GFR: 64.6 mL/min (ref >60.00)
GLUCOSE: 91 mg/dL (ref 70–99)
POTASSIUM: 4.1 meq/L (ref 3.5–5.1)
SODIUM: 137 meq/L (ref 135–145)

## 2014-03-30 NOTE — Telephone Encounter (Signed)
Returned Diana Glenn's call.  Reports some aching, mild throat congestion.  Reports that she still has nausea. + myalgia. Advised Diana Glenn that I think she should postpone her upcoming surgery and follow back up with me in 1 week for formal clearance. She verbalizes understanding. Left message for Dr. Sabino GasserMullins that I recommend she postpone surgery for approximately 1 week.

## 2014-03-30 NOTE — Assessment & Plan Note (Signed)
Scheduled for left nephrectomy on 11/2.  CXR is clear, lab work pending. See 10/29 phone note- due to gi and URI symptoms, I have advised pt to postpone surgery and follow up with me in 1 week for formal clearance.

## 2014-03-30 NOTE — Telephone Encounter (Signed)
Caller name: Rosey Batheresa  Call back number:[phone]   Reason for call:  Pt had flu shot yesterday, has some questions for you.  She has surgery scheduled Monday and wants to know if she should reschedule her surgery or can she ride it out.

## 2014-04-01 LAB — CULTURE, URINE COMPREHENSIVE

## 2014-04-02 ENCOUNTER — Other Ambulatory Visit: Payer: Self-pay | Admitting: Family

## 2014-04-02 NOTE — Telephone Encounter (Signed)
Please contact pt and let her know that urine showing UTI. Start ceftin.

## 2014-04-03 MED ORDER — CEFUROXIME AXETIL 500 MG PO TABS: 500.0000 mg | ORAL_TABLET | Freq: Two times a day (BID) | ORAL | Status: DC

## 2014-04-03 MED ORDER — LISINOPRIL 5 MG PO TABS: ORAL_TABLET | ORAL | Status: DC

## 2014-04-03 NOTE — Telephone Encounter (Signed)
Notified pt and she voices understanding. Rx sent. Pt also requests refill of lisinopril. #30 x 3 refills sent.

## 2014-05-01 ENCOUNTER — Telehealth: Payer: Self-pay | Admitting: Family

## 2014-05-01 MED ORDER — SERTRALINE HCL 100 MG PO TABS: 150.0000 mg | ORAL_TABLET | Freq: Every day | ORAL | Status: AC

## 2014-05-01 NOTE — Telephone Encounter (Signed)
Caller name: Diana Glenn, Diana Glenn Relation to pt: self  Call back number: [phone] [facility]     Reason for call:  Pt requesting a refill sertraline (ZOLOFT) 100 MG tablet

## 2014-05-01 NOTE — Telephone Encounter (Signed)
Refill sent, notified pt.  Pt states she returned home on Friday (s/p nephrectomy) and is able to now move around slowly.

## 2014-11-19 IMAGING — CR DG CHEST 2V
2 series · 2 of 2 positions shown · non-contrast
Comparison: 01/24/2014.

CLINICAL DATA: Hypertension.

EXAM:
CHEST  2 VIEW

[w chest pa]
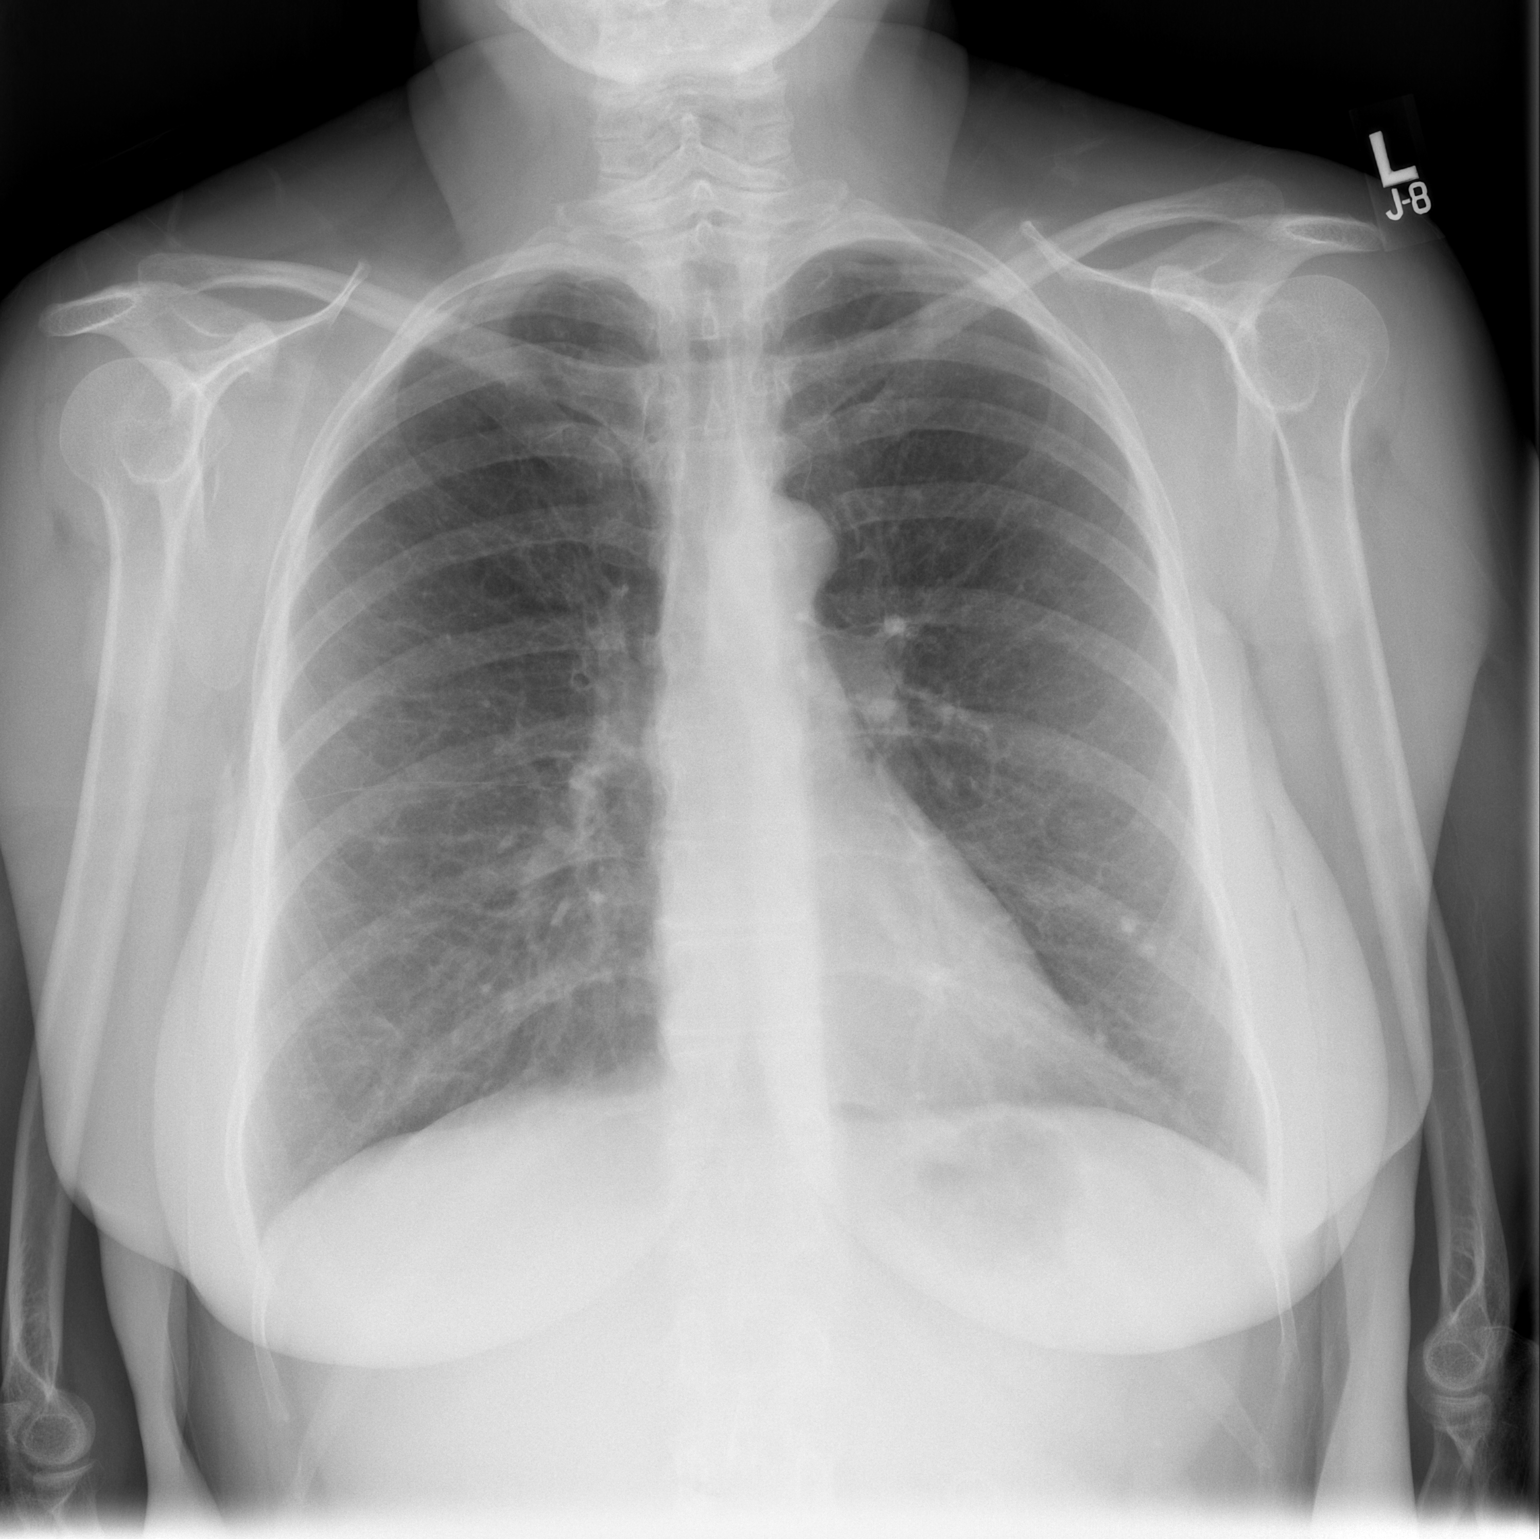

[w chest lat]
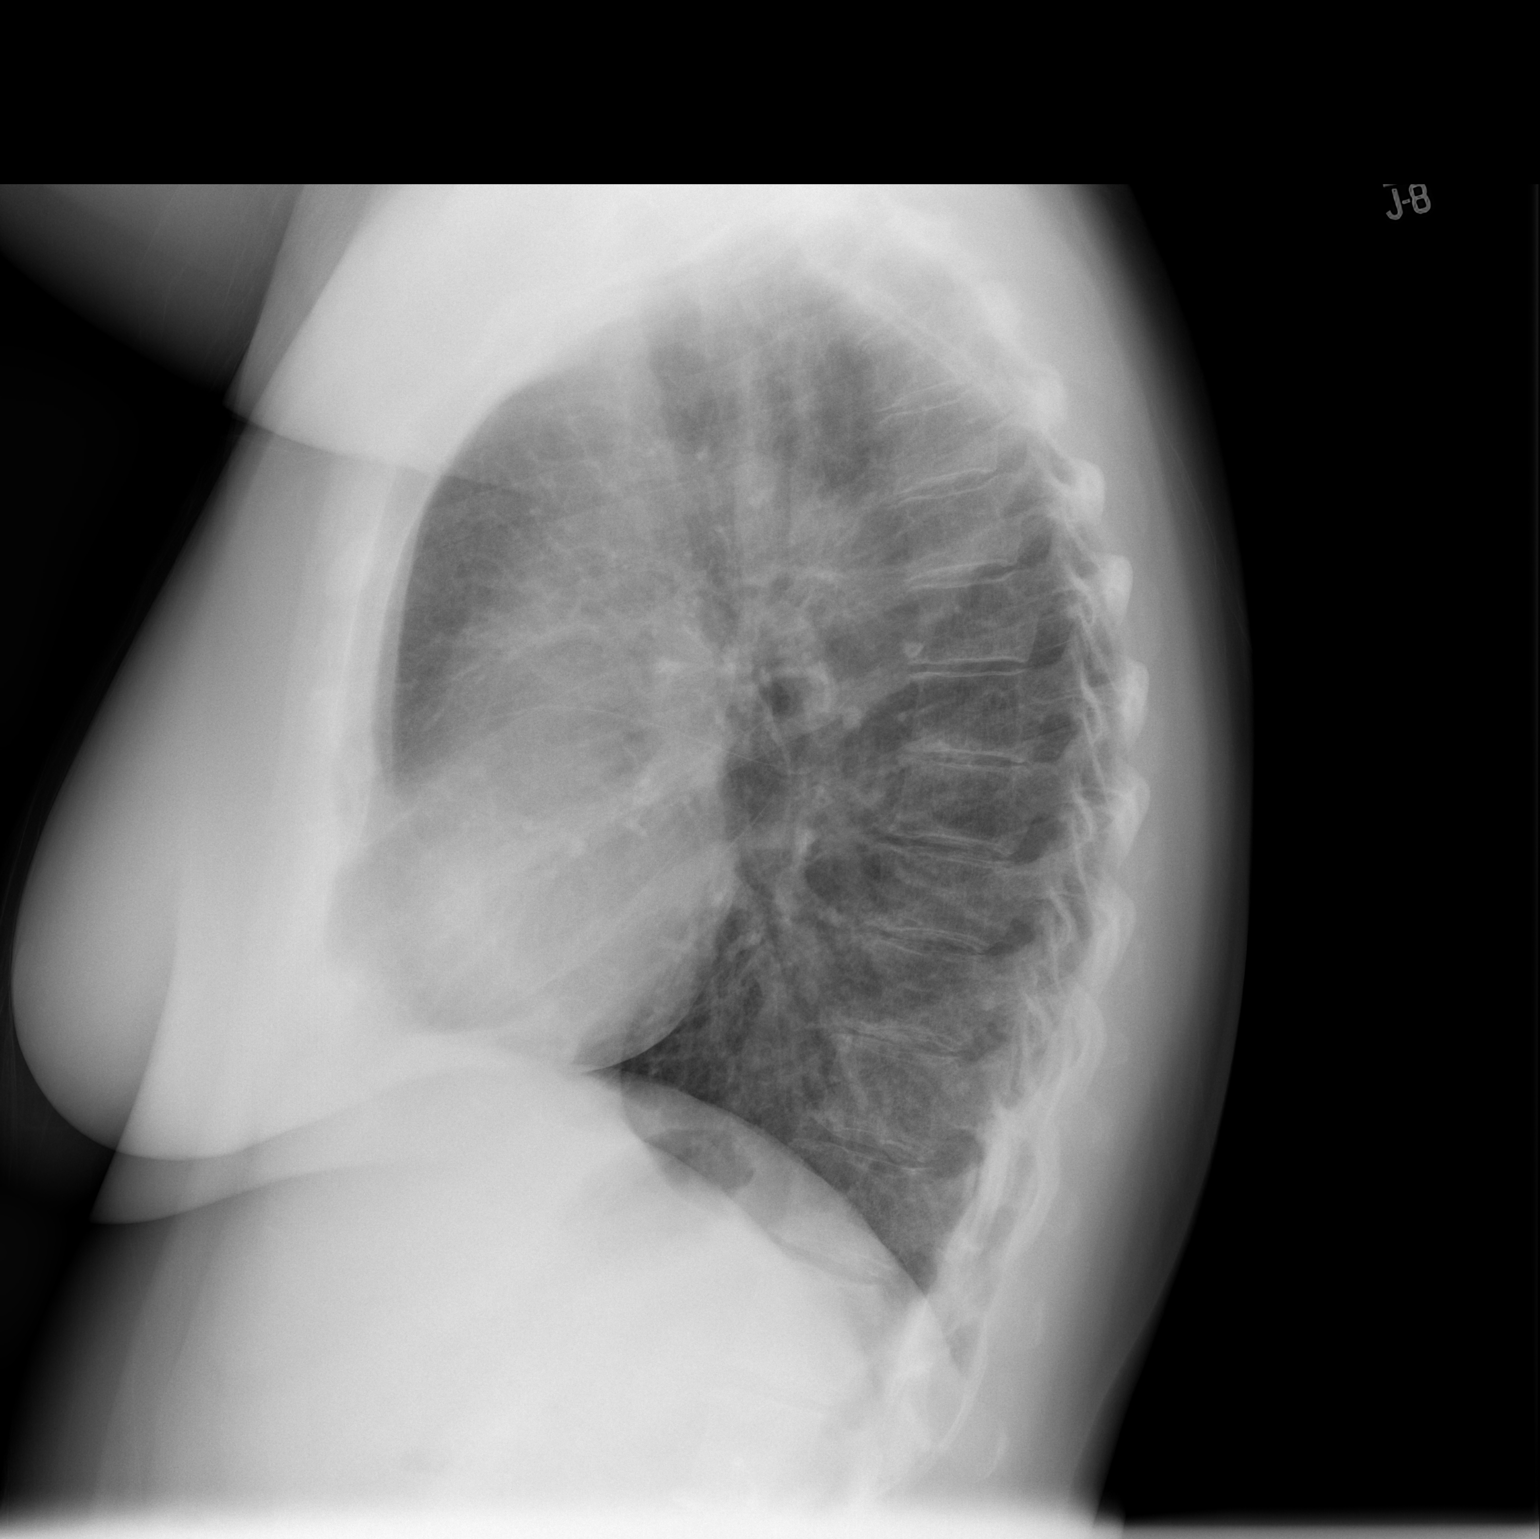

[2 of 2 positions shown; findings below may reference images not displayed]

FINDINGS: Mediastinum and hilar structures are normal. Heart size normal.
Previously identified diffuse pulmonary interstitial infiltrates
have cleared. No focal infiltrate noted. No pleural effusion or
pneumothorax. Tiny calcified nodules left lower lobe again noted and
consistent with small granulomas. No acute bony abnormality.
IMPRESSION: No active cardiopulmonary disease.

## 2015-03-16 ENCOUNTER — Ambulatory Visit: Payer: Self-pay | Admitting: Family

## 2015-03-16 ENCOUNTER — Telehealth: Payer: Self-pay | Admitting: Family

## 2015-03-16 DIAGNOSIS — Z0289 Encounter for other administrative examinations: Secondary | ICD-10-CM

## 2015-03-21 NOTE — Telephone Encounter (Signed)
Please charge  

## 2015-03-21 NOTE — Telephone Encounter (Signed)
Pt was no shwo 03/16/15 1:30pm, 30 min OV, pt has not rescheduled, charge or no charge?

## 2015-07-26 ENCOUNTER — Telehealth: Payer: Self-pay | Admitting: Family

## 2015-07-26 NOTE — Telephone Encounter (Signed)
Caller name: Dysheka   Relationship to patient: Outpatient Therapist at [facility]   Can be reached: [phone]  Reason for call: Pt is at [facility]. The outpatient therapist would like to have pt prescribed  . She would like to make provider aware that the pt does not have insurance.

## 2015-07-27 NOTE — Telephone Encounter (Signed)
I haven't see the patient in 2 years.  I need more info why she is needing PT please.

## 2015-07-27 NOTE — Telephone Encounter (Signed)
Called patient and left a message for them to get back in touch with [place] due to the request for outpaient pt.  Also called the outpatient therapist back to find out exaclty why there is a need for pt and i did not get an answer so left a message with therapist as well to return call.

## 2015-08-02 NOTE — Telephone Encounter (Addendum)
Called patient for clarification regarding message below.  Pt states that she is seeing Dr. Leonard Schwartz at [facility] and they will not prescribe any medication for anxiety.  Instead suggested counseling/therapy.  Therapist therefore called to ask if medication can be prescribed by PCP.  Appt scheduled with Melissa on 08/07/15 @ 9:45 am.     Called Dysheka and left message on phone for call back to make her aware of plan.

## 2015-08-07 ENCOUNTER — Encounter: Payer: Self-pay | Admitting: Family

## 2015-08-07 ENCOUNTER — Ambulatory Visit (INDEPENDENT_AMBULATORY_CARE_PROVIDER_SITE_OTHER): Payer: Self-pay | Admitting: Family

## 2015-08-07 VITALS — BP 102/66 | HR 80 | Temp 98.1°F | Ht 64.0 in | Wt 165.4 lb

## 2015-08-07 DIAGNOSIS — F418 Other specified anxiety disorders: Secondary | ICD-10-CM

## 2015-08-07 MED ORDER — CLONAZEPAM 0.5 MG PO TABS: 0.5000 mg | ORAL_TABLET | Freq: Two times a day (BID) | ORAL | Status: AC | PRN

## 2015-08-07 MED ORDER — ARIPIPRAZOLE 5 MG PO TABS: 5.0000 mg | ORAL_TABLET | Freq: Every day | ORAL | Status: AC

## 2015-08-07 NOTE — Progress Notes (Signed)
Subjective:    Patient ID: Diana Glenn, female    DOB: 09/10/1955, 60 y.o.   MRN: [id number]  HPI  Ms.  Denzil HughesCashatt is a 60 yr old  Female who presents today to discuss anxiety.  Reports that she continues zoloft.  Reports that her anxiety is uncontrolled.  Not sleeping well- only sleeping 4 hours a night.  Notes increased appetite.  Trouble getting out of the bed and house.  Denies substance abuse.  Reports + panic attacks.  Pt lives with her brother and his son but they work a lot so she spends much of her time alone. Denies SI/HI.   Wt Readings from Last 3 Encounters:  08/07/15 165 lb 6 oz (75.014 kg)  03/29/14 151 lb (68.493 kg)  02/27/14 151 lb 6.4 oz (68.675 kg)   In the past she has tried paxil, imipramine, effexor.  Reports she was unable to tolerate 200mg  of zoloft.  Effexor raised her blood pressure.  Reports that she had "jerking" with wellbutrin.  Has not been on buspar.    Review of Systems  Constitutional: Positive for unexpected weight change.  Respiratory: Negative for shortness of breath.   Cardiovascular: Negative for chest pain and palpitations.  Neurological: Negative for dizziness.       Past Medical History  Diagnosis Date  . Depression   . Diverticulitis   . Hypertension   . Stroke [facility]) 2007  . Kidney stone   . Urinary incontinence   . Agoraphobia with panic attacks   . Fatty liver     per ultrasound 5/13    Social History   Social History  . Marital Status: Widowed    Spouse Name: N/A  . Number of Children: 4  . Years of Education: N/A   Occupational History  . Not on file.   Social History Main Topics  . Smoking status: Current Every Day Smoker -- 1.50 packs/day for 30 years    Types: Cigarettes  . Smokeless tobacco: Not on file  . Alcohol Use: No  . Drug Use: No  . Sexual Activity: Not on file   Other Topics Concern  . Not on file   Social History Narrative   Caffeine Use:  2 cups daily   Regular exercise:  No          Past  Surgical History  Procedure Laterality Date  . Tonsillectomy  1982  . Tubal ligation  1991  . Uterine ablation  2002    Family History  Problem Relation Age of Onset  . Cancer Sister     lung  . Hypertension Brother   . Cancer Sister     lung  . Hypertension Brother   . Diabetes Brother   . Hypertension Brother   . Diabetes Brother     Allergies  Allergen Reactions  . Cortisone Other (See Comments)    Excitability, blurred vision  . Bactrim Itching and Rash    Current Outpatient Prescriptions on File Prior to Visit  Medication Sig Dispense Refill  . sertraline (ZOLOFT) 100 MG tablet Take 1.5 tablets (150 mg total) by mouth daily. Take 1 1/2 tablets once a day. 135 tablet 1  . Albuterol Sulfate 108 (90 BASE) MCG/ACT AEPB Inhale into the lungs. Reported on 08/07/2015    . ALPRAZolam (XANAX) 0.5 MG tablet Take 1 tablet (0.5 mg total) by mouth 2 (two) times daily as needed for anxiety. (Patient not taking: Reported on 08/07/2015) 30 tablet 0  . aspirin 81 MG  chewable tablet Chew 81 mg by mouth daily. Reported on 08/07/2015    . cefUROXime (CEFTIN) 500 MG tablet Take 1 tablet (500 mg total) by mouth 2 (two) times daily. (Patient not taking: Reported on 08/07/2015) 20 tablet 0  . cephALEXin (KEFLEX) 500 MG capsule Take 1 capsule (500 mg total) by mouth 2 (two) times daily. (Patient not taking: Reported on 08/07/2015) 14 capsule 0  . Fluticasone-Salmeterol (ADVAIR DISKUS IN) Inhale into the lungs. Reported on 08/07/2015    . lisinopril (PRINIVIL,ZESTRIL) 5 MG tablet TAKE ONE TABLET BY MOUTH ONCE DAILY (Patient not taking: Reported on 08/07/2015) 30 tablet 3   No current facility-administered medications on file prior to visit.    BP 102/66 mmHg  Pulse 80  Temp(Src) 98.1 F (36.7 C) (Oral)  Ht  (1.626 m)  Wt 165 lb 6 oz (75.014 kg)  BMI 28.37 kg/m2  SpO2 97%  LMP 06/27/2008    Objective:   Physical Exam  Constitutional: She appears well-developed and well-nourished. No distress.   Psychiatric: Her behavior is normal. Judgment and thought content normal.  Mildly anxious appearing          Assessment & Plan:

## 2015-08-07 NOTE — Patient Instructions (Signed)
Start abilify once daily. Continue zoloft. You may use the klonopin twice daily as needed for anxiety or insomnia.  Follow up in 1 month.

## 2015-08-07 NOTE — Progress Notes (Signed)
Pre visit review using our clinic review tool, if applicable. No additional management support is needed unless otherwise documented below in the visit note. 

## 2015-08-08 NOTE — Assessment & Plan Note (Signed)
Advised pt to continue current dose of zoloft.  Will add klonopin prn anxiety. Add Abilify for depression.
